# Patient Record
Sex: Female | Born: 1975 | Race: Black or African American | Hispanic: No | Marital: Single | Smoking: Never smoker
Health system: Southern US, Community
[De-identification: ages and names within clinical notes are randomized; demographics above are authoritative.]

## PROBLEM LIST (undated history)

## (undated) ENCOUNTER — Inpatient Hospital Stay (HOSPITAL_COMMUNITY): Payer: Self-pay

## (undated) DIAGNOSIS — T7840XA Allergy, unspecified, initial encounter: Secondary | ICD-10-CM

## (undated) DIAGNOSIS — K219 Gastro-esophageal reflux disease without esophagitis: Secondary | ICD-10-CM

## (undated) HISTORY — PX: FOOT SURGERY: SHX648

## (undated) HISTORY — DX: Allergy, unspecified, initial encounter: T78.40XA

## (undated) HISTORY — PX: COLONOSCOPY: SHX174

## (undated) HISTORY — PX: WISDOM TOOTH EXTRACTION: SHX21

---

## 2012-12-09 ENCOUNTER — Encounter (HOSPITAL_COMMUNITY): Payer: Self-pay | Admitting: Emergency Medicine

## 2012-12-09 ENCOUNTER — Emergency Department (HOSPITAL_COMMUNITY)
Admission: EM | Admit: 2012-12-09 | Discharge: 2012-12-09 | Disposition: A | Discharge status: Home / Self Care | Payer: Self-pay | Attending: Emergency Medicine | Admitting: Emergency Medicine

## 2012-12-09 DIAGNOSIS — IMO0001 Reserved for inherently not codable concepts without codable children: Secondary | ICD-10-CM | POA: Insufficient documentation

## 2012-12-09 DIAGNOSIS — R05 Cough: Secondary | ICD-10-CM | POA: Insufficient documentation

## 2012-12-09 DIAGNOSIS — R109 Unspecified abdominal pain: Secondary | ICD-10-CM | POA: Insufficient documentation

## 2012-12-09 DIAGNOSIS — R059 Cough, unspecified: Secondary | ICD-10-CM | POA: Insufficient documentation

## 2012-12-09 DIAGNOSIS — R6883 Chills (without fever): Secondary | ICD-10-CM | POA: Insufficient documentation

## 2012-12-09 DIAGNOSIS — Z3202 Encounter for pregnancy test, result negative: Secondary | ICD-10-CM | POA: Insufficient documentation

## 2012-12-09 DIAGNOSIS — R350 Frequency of micturition: Secondary | ICD-10-CM | POA: Insufficient documentation

## 2012-12-09 DIAGNOSIS — R197 Diarrhea, unspecified: Secondary | ICD-10-CM | POA: Insufficient documentation

## 2012-12-09 DIAGNOSIS — J3489 Other specified disorders of nose and nasal sinuses: Secondary | ICD-10-CM | POA: Insufficient documentation

## 2012-12-09 DIAGNOSIS — R110 Nausea: Secondary | ICD-10-CM | POA: Insufficient documentation

## 2012-12-09 LAB — CBC WITH DIFFERENTIAL/PLATELET
Basophils Absolute: 0 10*3/uL (ref 0.0–0.1)
Basophils Relative: 1 % (ref 0–1)
Eosinophils Absolute: 0 10*3/uL (ref 0.0–0.7)
Eosinophils Relative: 0 % (ref 0–5)
HCT: 37.4 % (ref 36.0–46.0)
Lymphocytes Relative: 51 % — ABNORMAL HIGH (ref 12–46)
MCH: 31.6 pg (ref 26.0–34.0)
MCHC: 34.8 g/dL (ref 30.0–36.0)
MCV: 90.8 fL (ref 78.0–100.0)
Monocytes Relative: 7 % (ref 3–12)
Neutrophils Relative %: 41 % — ABNORMAL LOW (ref 43–77)
Platelets: 259 10*3/uL (ref 150–400)
RBC: 4.12 MIL/uL (ref 3.87–5.11)
RDW: 12.4 % (ref 11.5–15.5)
WBC: 3.7 10*3/uL — ABNORMAL LOW (ref 4.0–10.5)

## 2012-12-09 LAB — URINE MICROSCOPIC-ADD ON

## 2012-12-09 LAB — URINALYSIS, ROUTINE W REFLEX MICROSCOPIC
Bilirubin Urine: NEGATIVE
Nitrite: NEGATIVE
Protein, ur: NEGATIVE mg/dL
Specific Gravity, Urine: 1.01 (ref 1.005–1.030)
Urobilinogen, UA: 0.2 mg/dL (ref 0.0–1.0)

## 2012-12-09 LAB — COMPREHENSIVE METABOLIC PANEL
ALT: 20 U/L (ref 0–35)
AST: 24 U/L (ref 0–37)
Albumin: 4.6 g/dL (ref 3.5–5.2)
Alkaline Phosphatase: 42 U/L (ref 39–117)
BUN: 9 mg/dL (ref 6–23)
CO2: 26 mEq/L (ref 19–32)
Calcium: 10 mg/dL (ref 8.4–10.5)
Chloride: 104 mEq/L (ref 96–112)
GFR calc Af Amer: 84 mL/min — ABNORMAL LOW (ref >90)
Glucose, Bld: 99 mg/dL (ref 70–99)
Potassium: 3.5 mEq/L (ref 3.5–5.1)
Sodium: 141 mEq/L (ref 135–145)
Total Bilirubin: 0.3 mg/dL (ref 0.3–1.2)
Total Protein: 7.8 g/dL (ref 6.0–8.3)

## 2012-12-09 LAB — PREGNANCY, URINE: Preg Test, Ur: NEGATIVE

## 2012-12-09 MED ORDER — ONDANSETRON HCL 4 MG PO TABS: 4.0000 mg | ORAL_TABLET | Freq: Four times a day (QID) | ORAL | Status: DC

## 2012-12-09 MED ORDER — ONDANSETRON 4 MG PO TBDP
4.0000 mg | ORAL_TABLET | Freq: Once | ORAL | Status: AC
  Administered 2012-12-09: 4 mg via ORAL
  Filled 2012-12-09: qty 1

## 2012-12-09 MED ORDER — FAMOTIDINE 20 MG PO TABS: 20.0000 mg | ORAL_TABLET | Freq: Two times a day (BID) | ORAL | Status: DC

## 2012-12-09 MED ORDER — SODIUM CHLORIDE 0.9 % IV SOLN
INTRAVENOUS | Status: AC
  Administered 2012-12-09: 09:00:00 via INTRAVENOUS

## 2012-12-09 NOTE — ED Notes (Signed)
Patient with no complaints at this time. Respirations even and unlabored. Skin warm/dry. Discharge instructions reviewed with patient at this time. Patient given opportunity to voice concerns/ask questions. IV removed per policy and band-aid applied to site. Patient discharged at this time and left Emergency Department with steady gait.  

## 2012-12-09 NOTE — ED Notes (Signed)
Patient c/o intermittent nausea, cough and diarrhea.  Chest soreness from coughing, slight epigastric and suprapubic tenderness to palpation.  Denies vomiting, dysuria or burning/stinging w/urination.

## 2012-12-09 NOTE — ED Notes (Signed)
Patient is resting comfortably. 

## 2012-12-09 NOTE — ED Notes (Signed)
Pt c/o  Intermittent chills, nausea, diarrhea that started a week ago,

## 2012-12-09 NOTE — ED Provider Notes (Signed)
CSN: [id number]     Arrival date & time 12/09/12  0807 History   First MD Initiated Contact with Patient 12/09/12 [phone]     Chief Complaint  Patient presents with  . Nausea  . Diarrhea   (Consider location/radiation/quality/duration/timing/severity/associated sxs/prior Treatment) Patient is a 37 y.o. female presenting with diarrhea. The history is provided by the patient.  Diarrhea Quality:  Watery (brown) Onset quality:  Gradual Duration:  1 week Timing:  Sporadic Progression:  Worsening Relieved by:  Nothing Worsened by:  Nothing tried Ineffective treatments:  Liquids Associated symptoms: abdominal pain (cramping), chills, cough, myalgias and URI   Associated symptoms: no fever and no headaches  Vomiting: nausea.   Risk factors: sick contacts   Risk factors: no recent antibiotic use, no suspicious food intake and no travel to endemic areas    Meghan Acosta is a 37 y.o. female who presents to the ED with nausea and diarrhea that started a week ago. She has stayed home and drinking clear liquids but continues to have diarrhea. Having 10 stools per day that are watery brown. Nausea but no vomiting. Thinks she has lost weight.   History reviewed. No pertinent past medical history. History reviewed. No pertinent past surgical history. No family history on file. History  Substance Use Topics  . Smoking status: Never Smoker   . Smokeless tobacco: Not on file  . Alcohol Use: No   OB History   Grav Para Term Preterm Abortions TAB SAB Ect Mult Living                 Review of Systems  Constitutional: Positive for chills. Negative for fever.  HENT: Positive for congestion. Negative for ear pain, sore throat and trouble swallowing.   Eyes: Negative for redness.  Respiratory: Positive for cough. Negative for chest tightness and shortness of breath.   Cardiovascular: Negative for chest pain.  Gastrointestinal: Positive for nausea, abdominal pain (cramping) and diarrhea.  Vomiting: nausea.  Genitourinary: Positive for frequency. Negative for dysuria, urgency, vaginal bleeding and vaginal discharge.  Musculoskeletal: Positive for myalgias. Negative for back pain.  Skin: Negative for rash.  Allergic/Immunologic: Negative for immunocompromised state.  Neurological: Negative for headaches.  Psychiatric/Behavioral: The patient is not nervous/anxious.     Allergies  Review of patient's allergies indicates no known allergies.  Home Medications  No current outpatient prescriptions on file. BP 116/74  Pulse 86  Temp(Src) 98.8 F (37.1 C) (Oral)  Resp 18  SpO2 100%  LMP 11/23/2012 Physical Exam  Nursing note and vitals reviewed. Constitutional: She is oriented to person, place, and time. She appears well-developed and well-nourished. No distress.  HENT:  Head: Normocephalic and atraumatic.  Eyes: EOM are normal.  Neck: Neck supple.  Cardiovascular: Normal rate and regular rhythm.   Pulmonary/Chest: Effort normal and breath sounds normal.  Abdominal: Soft. Normal appearance. Bowel sounds are increased. There is tenderness. There is no rigidity, no rebound, no guarding and no CVA tenderness.  Minimal tenderness with deep palpation across lower abdomen.  Musculoskeletal: Normal range of motion.  Neurological: She is alert and oriented to person, place, and time. No cranial nerve deficit.  Skin: Skin is warm and dry.  Psychiatric: She has a normal mood and affect. Her behavior is normal.   Results for orders placed during the hospital encounter of 12/09/12 (from the past 24 hour(s))  PREGNANCY, URINE     Status: None   Collection Time    12/09/12  8:20 AM  Result Value Range   Preg Test, Ur NEGATIVE  NEGATIVE  URINALYSIS, ROUTINE W REFLEX MICROSCOPIC     Status: Abnormal   Collection Time    12/09/12  8:20 AM      Result Value Range   Color, Urine YELLOW  YELLOW   APPearance CLEAR  CLEAR   Specific Gravity, Urine 1.010  1.005 - 1.030   pH 6.0   5.0 - 8.0   Glucose, UA NEGATIVE  NEGATIVE mg/dL   Hgb urine dipstick SMALL (*) NEGATIVE   Bilirubin Urine NEGATIVE  NEGATIVE   Ketones, ur NEGATIVE  NEGATIVE mg/dL   Protein, ur NEGATIVE  NEGATIVE mg/dL   Urobilinogen, UA 0.2  0.0 - 1.0 mg/dL   Nitrite NEGATIVE  NEGATIVE   Leukocytes, UA NEGATIVE  NEGATIVE  URINE MICROSCOPIC-ADD ON     Status: None   Collection Time    12/09/12  8:20 AM      Result Value Range   Squamous Epithelial / LPF RARE  RARE   WBC, UA 0-2  <3 WBC/hpf   RBC / HPF 3-6  <3 RBC/hpf   Bacteria, UA RARE  RARE  CBC WITH DIFFERENTIAL     Status: Abnormal   Collection Time    12/09/12  8:48 AM      Result Value Range   WBC 3.7 (*) 4.0 - 10.5 K/uL   RBC 4.12  3.87 - 5.11 MIL/uL   Hemoglobin 13.0  12.0 - 15.0 g/dL   HCT 16.1  09.6 - 04.5 %   MCV 90.8  78.0 - 100.0 fL   MCH 31.6  26.0 - 34.0 pg   MCHC 34.8  30.0 - 36.0 g/dL   RDW 40.9  81.1 - 91.4 %   Platelets 259  150 - 400 K/uL   Neutrophils Relative % 41 (*) 43 - 77 %   Neutro Abs 1.5 (*) 1.7 - 7.7 K/uL   Lymphocytes Relative 51 (*) 12 - 46 %   Lymphs Abs 1.9  0.7 - 4.0 K/uL   Monocytes Relative 7  3 - 12 %   Monocytes Absolute 0.3  0.1 - 1.0 K/uL   Eosinophils Relative 0  0 - 5 %   Eosinophils Absolute 0.0  0.0 - 0.7 K/uL   Basophils Relative 1  0 - 1 %   Basophils Absolute 0.0  0.0 - 0.1 K/uL  COMPREHENSIVE METABOLIC PANEL     Status: Abnormal   Collection Time    12/09/12  8:48 AM      Result Value Range   Sodium 141  135 - 145 mEq/L   Potassium 3.5  3.5 - 5.1 mEq/L   Chloride 104  96 - 112 mEq/L   CO2 26  19 - 32 mEq/L   Glucose, Bld 99  70 - 99 mg/dL   BUN 9  6 - 23 mg/dL   Creatinine, Ser 7.82  0.50 - 1.10 mg/dL   Calcium 95.6  8.4 - 21.3 mg/dL   Total Protein 7.8  6.0 - 8.3 g/dL   Albumin 4.6  3.5 - 5.2 g/dL   AST 24  0 - 37 U/L   ALT 20  0 - 35 U/L   Alkaline Phosphatase 42  39 - 117 U/L   Total Bilirubin 0.3  0.3 - 1.2 mg/dL   GFR calc non Af Amer 72 (*) >90 mL/min   GFR calc Af  Amer 84 (*) >90 mL/min    ED Course  Procedures @ 10:00  after IV hydration and Zofran for nausea the patient has no nausea. She is feeling better.  MDM  37 y.o. female with nausea and diarrhea x 1 week. She has not vomited or had diarrhea since she arrived to the ED. Labs and urine do not indicate dehydration at this time. Will start clear liquid diet and then advance to the B.R.A.T diet. She is to follow up with her PCP. Discussed with the patient need for follow up and stool culture if symptoms persist. She voices understanding.    Medication List    TAKE these medications       famotidine 20 MG tablet  Commonly known as:  PEPCID  Take 1 tablet (20 mg total) by mouth 2 (two) times daily.     ondansetron 4 MG tablet  Commonly known as:  ZOFRAN  Take 1 tablet (4 mg total) by mouth every 6 (six) hours.      ASK your doctor about these medications       MUCINEX FAST-MAX COLD & SINUS 10-650-400 MG/20ML Liqd  Generic drug:  Phenylephrine-APAP-Guaifenesin  Take 20 mLs by mouth every 4 (four) hours as needed (Congestion and Cough).     multivitamin with minerals Tabs tablet  Take 1 tablet by mouth daily.            [facility] Orlene Och, NP 12/09/12 1005

## 2012-12-12 NOTE — ED Provider Notes (Signed)
Medical screening examination/treatment/procedure(s) were performed by non-physician practitioner and as supervising physician I was immediately available for consultation/collaboration.  EKG Interpretation   None         Jaevin Medearis L Stanlee Roehrig, MD 12/12/12 1105 

## 2013-02-01 ENCOUNTER — Ambulatory Visit: Payer: Self-pay | Admitting: Family Medicine

## 2013-02-01 VITALS — BP 96/54 | HR 83 | Temp 98.3°F | Resp 16 | Ht 67.0 in | Wt 136.0 lb

## 2013-02-01 DIAGNOSIS — R350 Frequency of micturition: Secondary | ICD-10-CM

## 2013-02-01 DIAGNOSIS — R7309 Other abnormal glucose: Secondary | ICD-10-CM

## 2013-02-01 DIAGNOSIS — J329 Chronic sinusitis, unspecified: Secondary | ICD-10-CM

## 2013-02-01 LAB — POCT CBC
Granulocyte percent: 56.3 %G (ref 37–80)
HCT, POC: 41.5 % (ref 37.7–47.9)
Hemoglobin: 13 g/dL (ref 12.2–16.2)
Lymph, poc: 2 (ref 0.6–3.4)
MCH, POC: 30.9 pg (ref 27–31.2)
MCHC: 31.3 g/dL — AB (ref 31.8–35.4)
MCV: 98.5 fL — AB (ref 80–97)
MID (cbc): 0.3 (ref 0–0.9)
MPV: 8.8 fL (ref 0–99.8)
POC Granulocyte: 2.9 (ref 2–6.9)
POC LYMPH PERCENT: 38.8 %L (ref 10–50)
POC MID %: 4.9 %M (ref 0–12)
Platelet Count, POC: 239 10*3/uL (ref 142–424)
RBC: 4.21 M/uL (ref 4.04–5.48)
RDW, POC: 12.8 %
WBC: 5.2 10*3/uL (ref 4.6–10.2)

## 2013-02-01 LAB — POCT UA - MICROSCOPIC ONLY
Amorphous: POSITIVE
Casts, Ur, LPF, POC: NEGATIVE
Crystals, Ur, HPF, POC: NEGATIVE
WBC, Ur, HPF, POC: NEGATIVE
Yeast, UA: NEGATIVE

## 2013-02-01 LAB — POCT URINALYSIS DIPSTICK
BILIRUBIN UA: NEGATIVE
Glucose, UA: NEGATIVE
Ketones, UA: NEGATIVE
Leukocytes, UA: NEGATIVE
Nitrite, UA: NEGATIVE
PH UA: 6
Protein, UA: NEGATIVE
SPEC GRAV UA: 1.015
Urobilinogen, UA: 0.2

## 2013-02-01 LAB — POCT GLYCOSYLATED HEMOGLOBIN (HGB A1C): Hemoglobin A1C: 5

## 2013-02-01 MED ORDER — OXYBUTYNIN CHLORIDE 5 MG PO TABS: 5.0000 mg | ORAL_TABLET | Freq: Three times a day (TID) | ORAL | Status: DC | PRN

## 2013-02-01 MED ORDER — CETIRIZINE HCL 10 MG PO TABS
10.0000 mg | ORAL_TABLET | Freq: Every day | ORAL | Status: DC
Start: 2013-02-01 — End: 2013-11-14

## 2013-02-01 MED ORDER — PREDNISONE 20 MG PO TABS: ORAL_TABLET | ORAL | Status: DC

## 2013-02-01 MED ORDER — FLUTICASONE PROPIONATE 50 MCG/ACT NA SUSP: 2.0000 | Freq: Every day | NASAL | Status: DC

## 2013-02-01 NOTE — Progress Notes (Addendum)
Subjective:    Patient ID: Meghan Acosta, female    DOB: 1975-07-31, 38 y.o.   MRN: [id number]  Chief Complaint  Patient presents with  . Urinary Frequency  . Cough    sinusitis  . Abscess    groin area    HPI  This chart was scribed for Carley HammedEva Shaw-MD by Smiley HousemanFallon Davis, Scribe. This patient was seen in room 5 and the patient's care was started at 2:05 PM.  HPI Comments: Meghan Acosta is a 38 y.o. female who presents to [facility] complaining of a new bump on her left labia.  Pt states she was getting a [place] wax sev mos ago when she first noticed it - the woman waxing her said it was normal and that a lot of women had them.  It has not changed at all in the past sev mos.  Not tender, no draining. Pt states she has vaginal discharge, but usually just after her menstrual cycle and resolves on own monthly.    She also complains of urinary frequency for the past sev mos.  She reports that some days she is urinating almost every 20 minutes.  She states she is waking up multiple times during the night to urinate - 3 to 5 times.  She denies any dysuria or urinary incontinence or hesitancy. No pelvic or abd pain.  She states she feel thirsty, especially at night and does drink a lot of water. Sometimes she has urinary urgency and sometimes she has an urge to void and then is not able to void much urine.   She states she has loss weight recently without trying.  She attributed this to stress.  No change in appetite.   She states she has had a non productive cough that started about a month ago. She was prescribed azythromycin about a month ago for sinus infection, but her cough hasn't subsided.  She denies any ShOB or wheezing.  She reports she has frontal sinus pressure, left parietal sharp HA pain, ear "popping", and a couple epistaxis episodes.  She thinks the epistaxis is from the cold weather.  Pt states she uses Nasonex in the morning and evenings, but denies  taking allergy medications.  Has had a course of steroids for her sinuses in the past which has helped a lot and she would like to try this again if poss.  Pt also complains of right breast tenderness, especially around the nipple area.  She denies nipple discharge. Just for the past sev days - attributed to her bra.   No past surgical history on file.  No family history on file.  History   Social History  . Marital Status: Single    Spouse Name: N/A    Number of Children: N/A  . Years of Education: N/A   Occupational History  . Not on file.   Social History Main Topics  . Smoking status: Never Smoker   . Smokeless tobacco: Not on file  . Alcohol Use: No  . Drug Use: No  . Sexual Activity: Not on file   Other Topics Concern  . Not on file   Social History Narrative  . No narrative on file    No Known Allergies  There are no active problems to display for this patient.   Review of Systems  Constitutional: Positive for unexpected weight change. Negative for fever and chills.  HENT: Positive for congestion, nosebleeds, postnasal drip, rhinorrhea and sinus pressure (frontal).  Negative for dental problem, ear discharge, ear pain, facial swelling, hearing loss and sore throat.   Respiratory: Positive for cough. Negative for shortness of breath.   Cardiovascular: Negative for chest pain.  Gastrointestinal: Negative for nausea, vomiting, abdominal pain and diarrhea.  Genitourinary: Positive for urgency, frequency, decreased urine volume and vaginal discharge. Negative for dysuria, hematuria, flank pain, vaginal bleeding, difficulty urinating, genital sores, vaginal pain, menstrual problem, pelvic pain and dyspareunia.  Musculoskeletal: Negative for back pain.  Skin: Negative for color change and rash.  Neurological: Positive for headaches (left parietal sharp pain). Negative for syncope.  right lateral breast pain   Triage Vitals: BP 96/54  Pulse 83  Temp(Src) 98.3 F  (36.8 C) (Oral)  Resp 16  Ht 5\' 7"  (1.702 m)  Wt 136 lb (61.689 kg)  BMI 21.30 kg/m2  SpO2 100% Objective:   Physical Exam  Nursing note and vitals reviewed. Constitutional: She is oriented to person, place, and time. Vital signs are normal. She appears well-developed and well-nourished. No distress.  HENT:  Head: Normocephalic and atraumatic.  Right Ear: External ear and ear canal normal. Tympanic membrane is retracted.  Left Ear: Tympanic membrane, external ear and ear canal normal.  Nose: Mucosal edema present. Right sinus exhibits frontal sinus tenderness.  Mouth/Throat: Uvula is midline, oropharynx is clear and moist and mucous membranes are normal. No oropharyngeal exudate, posterior oropharyngeal edema or posterior oropharyngeal erythema.  Eyes: Conjunctivae and EOM are normal. Right eye exhibits no discharge. Left eye exhibits no discharge.  Neck: Trachea normal. Neck supple. No tracheal deviation present. No thyromegaly present.  Cardiovascular: Normal rate, regular rhythm and normal heart sounds.   Pulmonary/Chest: Effort normal and breath sounds normal. No respiratory distress. She has no wheezes. She has no rales.  Genitourinary:    There is breast tenderness (Right breast). No breast swelling, discharge or bleeding. There is no rash, tenderness, lesion or injury on the right labia. There is no rash, tenderness, lesion or injury on the left labia.  Small 3-4 mm subcu firm nodule palpable in mid left external labia majora - not visible at all, no overlying skin changes Normal right breast exam, with mild fibrocystic changes  Musculoskeletal: Normal range of motion.  Lymphadenopathy:       Head (right side): No submandibular, no tonsillar, no preauricular, no posterior auricular and no occipital adenopathy present.       Head (left side): No submandibular, no tonsillar, no preauricular, no posterior auricular and no occipital adenopathy present.    She has no cervical  adenopathy.       Right axillary: No pectoral and no lateral adenopathy present.       Right: No supraclavicular adenopathy present.       Left: No supraclavicular adenopathy present.  Neurological: She is alert and oriented to person, place, and time.  Skin: Skin is warm and dry.  Psychiatric: She has a normal mood and affect. Her behavior is normal.   Results for orders placed in visit on 02/01/13  POCT UA - MICROSCOPIC ONLY      Result Value Range   WBC, Ur, HPF, POC neg     RBC, urine, microscopic 0-3     Bacteria, U Microscopic 1+     Mucus, UA mod     Epithelial cells, urine per micros 0-2     Crystals, Ur, HPF, POC neg     Casts, Ur, LPF, POC neg     Yeast, UA neg  Amorphous pos    POCT URINALYSIS DIPSTICK      Result Value Range   Color, UA yellow     Clarity, UA clear     Glucose, UA neg     Bilirubin, UA neg     Ketones, UA neg     Spec Grav, UA 1.015     Blood, UA trace-lysed     pH, UA 6.0     Protein, UA neg     Urobilinogen, UA 0.2     Nitrite, UA neg     Leukocytes, UA Negative    POCT CBC      Result Value Range   WBC 5.2  4.6 - 10.2 K/uL   Lymph, poc 2.0  0.6 - 3.4   POC LYMPH PERCENT 38.8  10 - 50 %L   MID (cbc) 0.3  0 - 0.9   POC MID % 4.9  0 - 12 %M   POC Granulocyte 2.9  2 - 6.9   Granulocyte percent 56.3  37 - 80 %G   RBC 4.21  4.04 - 5.48 M/uL   Hemoglobin 13.0  12.2 - 16.2 g/dL   HCT, POC 16.1  09.6 - 47.9 %   MCV 98.5 (*) 80 - 97 fL   MCH, POC 30.9  27 - 31.2 pg   MCHC 31.3 (*) 31.8 - 35.4 g/dL   RDW, POC 04.5     Platelet Count, POC 239  142 - 424 K/uL   MPV 8.8  0 - 99.8 fL  POCT GLYCOSYLATED HEMOGLOBIN (HGB A1C)      Result Value Range   Hemoglobin A1C 5.0        Assessment & Plan:  Urinary frequency - Plan: POCT UA - Microscopic Only, POCT urinalysis dipstick - unknown etiology, rec tsh at some point - esp cons unintentional weight loss - but pt w/o health insurance so outside labs deferred for now.  Rec therapeutic trial of  oxybutynin.  Recommend drinking plenty of water during the day, but stop after 6 p.m.  Abnormal blood sugar - Plan: POCT CBC, POCT glycosylated hemoglobin (Hb A1C)  Unspecified sinusitis (chronic) - try steroid taper due to prolonged congestion sxs, cont nasal steroid.  Inclusion cyst on left labia - pt reassured - benign and small - can try warm compresses sev x/d for sev days to see if can reduce size but unlikely to be successful.  Just leave alone - has the potential to become infected so if tender or increasing in size, RTC.  Meds ordered this encounter  Medications  . predniSONE (DELTASONE) 20 MG tablet    Sig: Take 3 tabs po qd x 3d, then 2 tabs po qd x 3d, then 1 tab po qd x 3d    Dispense:  18 tablet    Refill:  0  . cetirizine (ZYRTEC) 10 MG tablet    Sig: Take 1 tablet (10 mg total) by mouth daily.    Dispense:  30 tablet    Refill:  11  . fluticasone (FLONASE) 50 MCG/ACT nasal spray    Sig: Place 2 sprays into both nostrils at bedtime.    Dispense:  16 g    Refill:  2  . oxybutynin (DITROPAN) 5 MG tablet    Sig: Take 1 tablet (5 mg total) by mouth every 8 (eight) hours as needed for bladder spasms.    Dispense:  90 tablet    Refill:  1    I personally performed the  services described in this documentation, which was scribed in my presence. The recorded information has been reviewed and considered, and addended by me as needed.  Delman Cheadle, MD MPH

## 2013-11-14 ENCOUNTER — Encounter (HOSPITAL_COMMUNITY): Payer: Self-pay | Admitting: Emergency Medicine

## 2013-11-14 ENCOUNTER — Emergency Department (HOSPITAL_COMMUNITY)
Admission: EM | Admit: 2013-11-14 | Discharge: 2013-11-14 | Disposition: A | Discharge status: Home / Self Care | Payer: BC Managed Care – PPO | Attending: Emergency Medicine | Admitting: Emergency Medicine

## 2013-11-14 DIAGNOSIS — Z3202 Encounter for pregnancy test, result negative: Secondary | ICD-10-CM | POA: Insufficient documentation

## 2013-11-14 DIAGNOSIS — K922 Gastrointestinal hemorrhage, unspecified: Secondary | ICD-10-CM

## 2013-11-14 DIAGNOSIS — R195 Other fecal abnormalities: Secondary | ICD-10-CM | POA: Diagnosis present

## 2013-11-14 DIAGNOSIS — R197 Diarrhea, unspecified: Secondary | ICD-10-CM | POA: Insufficient documentation

## 2013-11-14 DIAGNOSIS — K921 Melena: Secondary | ICD-10-CM | POA: Insufficient documentation

## 2013-11-14 LAB — CBC WITH DIFFERENTIAL/PLATELET
BASOS ABS: 0 10*3/uL (ref 0.0–0.1)
Basophils Relative: 0 % (ref 0–1)
Eosinophils Absolute: 0 10*3/uL (ref 0.0–0.7)
Eosinophils Relative: 1 % (ref 0–5)
HCT: 34.2 % — ABNORMAL LOW (ref 36.0–46.0)
Hemoglobin: 11.8 g/dL — ABNORMAL LOW (ref 12.0–15.0)
LYMPHS PCT: 53 % — AB (ref 12–46)
Lymphs Abs: 2.5 10*3/uL (ref 0.7–4.0)
MCH: 31.6 pg (ref 26.0–34.0)
MCHC: 34.5 g/dL (ref 30.0–36.0)
MCV: 91.7 fL (ref 78.0–100.0)
Monocytes Absolute: 0.3 10*3/uL (ref 0.1–1.0)
Monocytes Relative: 6 % (ref 3–12)
NEUTROS ABS: 1.9 10*3/uL (ref 1.7–7.7)
NEUTROS PCT: 40 % — AB (ref 43–77)
PLATELETS: 230 10*3/uL (ref 150–400)
RBC: 3.73 MIL/uL — ABNORMAL LOW (ref 3.87–5.11)
RDW: 12.5 % (ref 11.5–15.5)
WBC: 4.7 10*3/uL (ref 4.0–10.5)

## 2013-11-14 LAB — COMPREHENSIVE METABOLIC PANEL
ALBUMIN: 4.1 g/dL (ref 3.5–5.2)
ALK PHOS: 39 U/L (ref 39–117)
ALT: 16 U/L (ref 0–35)
AST: 23 U/L (ref 0–37)
Anion gap: 10 (ref 5–15)
BUN: 12 mg/dL (ref 6–23)
CHLORIDE: 104 meq/L (ref 96–112)
CO2: 26 meq/L (ref 19–32)
Calcium: 9.6 mg/dL (ref 8.4–10.5)
Creatinine, Ser: 1.05 mg/dL (ref 0.50–1.10)
GFR calc Af Amer: 78 mL/min — ABNORMAL LOW (ref >90)
GFR calc non Af Amer: 67 mL/min — ABNORMAL LOW (ref >90)
Glucose, Bld: 88 mg/dL (ref 70–99)
Potassium: 4 mEq/L (ref 3.7–5.3)
SODIUM: 140 meq/L (ref 137–147)
Total Bilirubin: 0.2 mg/dL — ABNORMAL LOW (ref 0.3–1.2)
Total Protein: 7.1 g/dL (ref 6.0–8.3)

## 2013-11-14 LAB — URINALYSIS, ROUTINE W REFLEX MICROSCOPIC
BILIRUBIN URINE: NEGATIVE
Glucose, UA: NEGATIVE mg/dL
Hgb urine dipstick: NEGATIVE
KETONES UR: NEGATIVE mg/dL
Leukocytes, UA: NEGATIVE
Nitrite: NEGATIVE
Protein, ur: NEGATIVE mg/dL
SPECIFIC GRAVITY, URINE: 1.02 (ref 1.005–1.030)
UROBILINOGEN UA: 0.2 mg/dL (ref 0.0–1.0)
pH: 7.5 (ref 5.0–8.0)

## 2013-11-14 LAB — PREGNANCY, URINE: Preg Test, Ur: NEGATIVE

## 2013-11-14 LAB — APTT: APTT: 29 s (ref 24–37)

## 2013-11-14 LAB — PROTIME-INR
INR: 1.02 (ref 0.00–1.49)
Prothrombin Time: 13.5 seconds (ref 11.6–15.2)

## 2013-11-14 MED ORDER — MORPHINE SULFATE 4 MG/ML IJ SOLN: 4.0000 mg | Freq: Once | INTRAMUSCULAR | Status: DC

## 2013-11-14 MED ORDER — ONDANSETRON 4 MG PO TBDP
4.0000 mg | ORAL_TABLET | Freq: Once | ORAL | Status: AC
  Administered 2013-11-14: 4 mg via ORAL
  Filled 2013-11-14: qty 1

## 2013-11-14 MED ORDER — OXYCODONE-ACETAMINOPHEN 5-325 MG PO TABS
2.0000 | ORAL_TABLET | Freq: Once | ORAL | Status: AC
  Administered 2013-11-14: 2 via ORAL
  Filled 2013-11-14: qty 2

## 2013-11-14 NOTE — ED Notes (Signed)
PA at bedside.

## 2013-11-14 NOTE — ED Provider Notes (Signed)
38 year old female, otherwise healthy presents with several small bowel movements with bright red blood. She has some suprapubic cramping associated with this but no rectal pain or discomfort. She denies being on any blood thinners. On exam the patient has mild suprapubic tenderness but no other abdominal tenderness, no guarding, no peritoneal signs. Lungs and heart normal exam, no tachycardia, no peripheral edema, the patient is in no acute distress. Rectal exam with chaperone present showed no stool in the rectal vault, rectal secretions and mucous stain Hemoccult positive, no fissures, no hemorrhoids, no masses. Patient tolerated exam.  The patient is a hemoglobin which is approximately 1 g lower than 10 months ago, she appears hemodynamically stable, we'll observe for short period, try to arrange outpatient followup, would include arteriovenous malformation, diverticulosis, internal hemorrhoid as possibilities, the patient does not appear acutely ill.  Medical screening examination/treatment/procedure(s) were conducted as a shared visit with non-physician practitioner(s) and myself.  I personally evaluated the patient during the encounter.  Clinical Impression:   Final diagnoses:  Lower GI bleed         Vida RollerBrian D Devina Bezold, MD 11/15/13 1455

## 2013-11-14 NOTE — ED Provider Notes (Signed)
CSN: [id number]     Arrival date & time 11/14/13  1650 History   First MD Initiated Contact with Patient 11/14/13 1720     Chief Complaint  Patient presents with  . Blood In Stools   Meghan Acosta is a 38 year-old female who presents to the ED c/o bright red blood in stools and suprapubic pain since this morning. Reports that when she had her normal BM this am she had suprapubic pain and noticed bright red blood mixed in the toilet with her stool. She c/o sharp, cramping pain in her abd that is worse with movement. Describes a sensation of needing a bowel movement. Reports she thought she was starting her menses but has not had any blood on her tampons. Her LMP was 10/16/13 and she has not been sexually active since 5/15. Reports taking advil earlier today without relief. Denies hx of abd surgeries, hemorrhoids, anal pain, hx of fissures, melena, anal penetration, aspirin use or EtOH use. Denies vomiting, chest pain, fevers or chills. Denies dysuria, hematuria, vaginal discharge or bleeding.   (Consider location/radiation/quality/duration/timing/severity/associated sxs/prior Treatment) Patient is a 38 y.o. female presenting with abdominal pain. The history is provided by the patient.  Abdominal Pain Pain location:  Suprapubic Pain quality: bloating, cramping and sharp   Pain radiates to:  Does not radiate Pain severity:  Moderate Onset quality:  Gradual Duration:  10 hours Timing:  Constant Progression:  Unchanged Chronicity:  New Context: not previous surgeries, not recent illness, not recent sexual activity, not recent travel, not retching, not sick contacts, not suspicious food intake and not trauma   Relieved by:  Position changes Worsened by:  Palpation and movement Ineffective treatments:  NSAIDs Associated symptoms: diarrhea, flatus and hematochezia   Associated symptoms: no chest pain, no chills, no constipation, no cough, no dysuria, no fatigue, no fever, no hematemesis, no  hematuria, no melena, no nausea, no shortness of breath, no vaginal bleeding, no vaginal discharge and no vomiting   Risk factors: no alcohol abuse, no aspirin use, not elderly, has not had multiple surgeries, not obese, not pregnant and no recent hospitalization     Past Medical History  Diagnosis Date  . Allergy    History reviewed. No pertinent past surgical history. History reviewed. No pertinent family history. History  Substance Use Topics  . Smoking status: Never Smoker   . Smokeless tobacco: Not on file  . Alcohol Use: No   OB History   Grav Para Term Preterm Abortions TAB SAB Ect Mult Living                 Review of Systems  Constitutional: Negative for fever, chills and fatigue.  Respiratory: Negative for cough and shortness of breath.   Cardiovascular: Negative for chest pain.  Gastrointestinal: Positive for abdominal pain, diarrhea, hematochezia and flatus. Negative for nausea, vomiting, constipation, melena and hematemesis.  Genitourinary: Negative for dysuria, hematuria, vaginal bleeding and vaginal discharge.  All other systems reviewed and are negative.     Allergies  Review of patient's allergies indicates no known allergies.  Home Medications   Prior to Admission medications   Not on File   BP 112/71  Pulse 66  Temp(Src) 98.1 F (36.7 C) (Oral)  Resp 20  Ht 5\' 7"  (1.702 m)  Wt 138 lb (62.596 kg)  BMI 21.61 kg/m2  SpO2 96%  LMP 10/15/2013 Physical Exam  Constitutional: She is oriented to person, place, and time. She appears well-developed and well-nourished. No distress.  HENT:  Head: Normocephalic and atraumatic.  Mouth/Throat: Oropharynx is clear and moist. No oropharyngeal exudate.  Eyes: Pupils are equal, round, and reactive to light. No scleral icterus.  Neck: Normal range of motion. Neck supple.  Cardiovascular: Normal rate, regular rhythm, normal heart sounds and intact distal pulses.  Exam reveals no gallop and no friction rub.   No  murmur heard. Pulmonary/Chest: Effort normal and breath sounds normal. No respiratory distress. She has no wheezes. She has no rales. She exhibits no tenderness.  Abdominal: Soft. Bowel sounds are normal. She exhibits no distension and no mass. There is tenderness. There is no rebound and no guarding.  Moderate suprapubic tenderness  Genitourinary: Guaiac positive stool.  Guaiac positive. No stool in rectal vault. Anal sphincter tone normal. No internal or external hemorrhoids. Female tech chaperone present.   Musculoskeletal: Normal range of motion.  Lymphadenopathy:    She has no cervical adenopathy.  Neurological: She is alert and oriented to person, place, and time.  Skin: Skin is warm and dry. She is not diaphoretic.  Psychiatric: She has a normal mood and affect. Her behavior is normal.    ED Course  Procedures (including critical care time) Labs Review Labs Reviewed  CBC WITH DIFFERENTIAL - Abnormal; Notable for the following:    RBC 3.73 (*)    Hemoglobin 11.8 (*)    HCT 34.2 (*)    Neutrophils Relative % 40 (*)    Lymphocytes Relative 53 (*)    All other components within normal limits  COMPREHENSIVE METABOLIC PANEL - Abnormal; Notable for the following:    Total Bilirubin 0.2 (*)    GFR calc non Af Amer 67 (*)    GFR calc Af Amer 78 (*)    All other components within normal limits  URINALYSIS, ROUTINE W REFLEX MICROSCOPIC  PREGNANCY, URINE  APTT  PROTIME-INR    Imaging Review No results found.   EKG Interpretation None      MDM   Final diagnoses:  Lower GI bleed    At 2048 patient reports she is feeling better and her pain is down to 2/10. She reports she is starting to feel hungry. She has not had a bowel movement since her arrival to ED.  Her Guaiac was positive with no stool in the rectal vault. Sphincter tone was normal. No evidence of hemorrhoids or fissures.   Her Hgb was 11.8 today which is about 1 g/dL lower than her Hgb 9 months ago. Her vitals  have been WNL since arrival. She has not had a BM since arrival to the ED. UA and urine pregnancy is negative. Spoke with patient that this is concerning that she is having blood in her stool and needs to follow-up with her PCP and GI within one week. She does not need further interventions tonight. Advised to avoid NSAIDs, aspirin and alcohol. Advised to return to the ED if new or worsening symptoms. Patient verbalized understanding and agreement with plan.   Patient seen in conjunction with Dr. Hyacinth MeekerMiller.     Meghan Acosta, [place] 11/14/13 2152

## 2013-11-14 NOTE — ED Notes (Signed)
Patient verbalizes understanding of discharge instructions, follow up care and home care. Patient ambulatory out of department at this time escorted by family.

## 2013-11-14 NOTE — Discharge Instructions (Signed)
Avoid using apirin containing products or nsaids (ibuprofen).    [facility] List    Kari BaarsEdward Hawkins MD. Specialty: Pulmonary Disease Contact information: [address]  [address]  [place] [address]  [phone]   Syliva OvermanMargaret Simpson, MD. Specialty: [facility] Contact information: [address]  [place] [address]  [phone]   Lilyan PuntScott Luking, MD. Specialty: [facility] Contact information: [address]  [address]  [place] [address]  [phone]   Avon Gullyesfaye Fanta, MD Specialty: Internal Medicine Contact information: [address]  [place] [address]  [phone]   Catalina PizzaZach Hall, MD. Specialty: Internal Medicine Contact information: [address]  [place] [address]  [phone]   Butch PennyAngus Mcinnis, MD. Specialty: Family Medicine Contact information: [address]  [place] [address]  [phone]   John GiovanniStephen Knowlton, MD. Specialty: [facility] Contact information: [address]  [address]  [place] [address]  [phone]   Carylon Perchesoy Fagan, MD. Specialty: Internal Medicine Contact information: [address]  [address]  [place] [address]  [phone]

## 2013-11-14 NOTE — ED Notes (Signed)
PT c/o cramping lower abdominal pain today with bright red noted with loose bowel movements x2. PT denies any urinary symptoms.

## 2013-11-14 NOTE — ED Notes (Signed)
Pt states she had blood in her stools 2x starting today. She woke up with abdominal pain and made 1 loose bright red stool and thought she may have started her period. Pt went on throughout the day and had another loose stool with bright red blood this afternoon. Pt used tampon but no menses was present when she went to change her tampon.

## 2013-11-15 NOTE — ED Provider Notes (Signed)
Medical screening examination/treatment/procedure(s) were conducted as a shared visit with non-physician practitioner(s) and myself.  I personally evaluated the patient during the encounter  Please see my separate respective documentation pertaining to this patient encounter   Vida RollerBrian D Lillien Petronio, MD 11/15/13 1455

## 2013-11-16 ENCOUNTER — Encounter: Payer: Self-pay | Admitting: Gastroenterology

## 2013-11-16 ENCOUNTER — Ambulatory Visit (INDEPENDENT_AMBULATORY_CARE_PROVIDER_SITE_OTHER): Payer: BC Managed Care – PPO | Admitting: Gastroenterology

## 2013-11-16 ENCOUNTER — Other Ambulatory Visit: Payer: Self-pay

## 2013-11-16 VITALS — BP 105/62 | HR 74 | Temp 97.8°F | Ht 67.0 in | Wt 139.0 lb

## 2013-11-16 DIAGNOSIS — K625 Hemorrhage of anus and rectum: Secondary | ICD-10-CM | POA: Insufficient documentation

## 2013-11-16 DIAGNOSIS — R195 Other fecal abnormalities: Secondary | ICD-10-CM

## 2013-11-16 MED ORDER — PEG 3350-KCL-NA BICARB-NACL 420 G PO SOLR: 4000.0000 mL | ORAL | Status: DC

## 2013-11-16 NOTE — Patient Instructions (Signed)
1. Colonoscopy with Dr. Rourk. Please see separate instructions. 

## 2013-11-16 NOTE — Progress Notes (Signed)
Primary Care Physician:  No PCP Per Patient  Primary Gastroenterologist:  Roetta SessionsMichael Rourk, MD   Chief Complaint  Patient presents with  . Blood In Stools    bright red  . Abdominal Pain    HPI:  Meghan Acosta is a 38 y.o. female here for evaluation of moderate volume brbpr. Brbpr, large amount initially. Thought menstrual cycle. Stool loose, abdominal cramps. Smaller amount of blood since then but multiple.  Usually bowel movement regular and ok. Rare loose. Once to twice daily. Couple of weeks ago, cramps, loose stool postprandially but no blood in the stool then. No weight loss. No significant gerd, dysphagia, vomiting. Appetite good.     No current outpatient prescriptions on file.   No current facility-administered medications for this visit.    Allergies as of 11/16/2013  . (No Known Allergies)    Past Medical History  Diagnosis Date  . Allergy     History reviewed. No pertinent past surgical history.  Family History  Problem Relation Age of Onset  . Colon cancer Neg Hx   . Cervical cancer Paternal Aunt   . Inflammatory bowel disease Neg Hx     History   Social History  . Marital Status: Single    Spouse Name: N/A    Number of Children: 0  . Years of Education: N/A   Occupational History  . office [employer]   Social History Main Topics  . Smoking status: Never Smoker   . Smokeless tobacco: Not on file  . Alcohol Use: No  . Drug Use: No  . Sexual Activity: Not on file   Other Topics Concern  . Not on file   Social History Narrative  . No narrative on file      ROS:  General: Negative for anorexia, weight loss, fever, chills, fatigue, weakness. Eyes: Negative for vision changes.  ENT: Negative for hoarseness, difficulty swallowing , nasal congestion. CV: Negative for chest pain, angina, palpitations, dyspnea on exertion, peripheral edema.  Respiratory: Negative for dyspnea at rest, dyspnea on exertion, cough, sputum,  wheezing.  GI: See history of present illness. GU:  Negative for dysuria, hematuria, urinary incontinence, urinary frequency, nocturnal urination.  MS: Negative for joint pain, low back pain.  Derm: Negative for rash or itching.  Neuro: Negative for weakness, abnormal sensation, seizure, frequent headaches, memory loss, confusion.  Psych: Negative for anxiety, depression, suicidal ideation, hallucinations.  Endo: Negative for unusual weight change.  Heme: Negative for bruising or bleeding. Allergy: Negative for rash or hives.    Physical Examination:  BP 105/62  Pulse 74  Temp(Src) 97.8 F (36.6 C) (Oral)  Ht 5\' 7"  (1.702 m)  Wt 139 lb (63.05 kg)  BMI 21.77 kg/m2  LMP 11/15/2013   General: Well-nourished, well-developed in no acute distress.  Head: Normocephalic, atraumatic.   Eyes: Conjunctiva pink, no icterus. Mouth: Oropharyngeal mucosa moist and pink , no lesions erythema or exudate. Neck: Supple without thyromegaly, masses, or lymphadenopathy.  Lungs: Clear to auscultation bilaterally.  Heart: Regular rate and rhythm, no murmurs rubs or gallops.  Abdomen: Bowel sounds are normal, nontender, nondistended, no hepatosplenomegaly or masses, no abdominal bruits or    hernia , no rebound or guarding.   Rectal: deferred Extremities: No lower extremity edema. No clubbing or deformities.  Neuro: Alert and oriented x 4 , grossly normal neurologically.  Skin: Warm and dry, no rash or jaundice.   Psych: Alert and cooperative, normal mood and affect.  Labs: Lab Results  Component Value Date   WBC 4.7 11/14/2013   HGB 11.8* 11/14/2013   HCT 34.2* 11/14/2013   MCV 91.7 11/14/2013   PLT 230 11/14/2013   Lab Results  Component Value Date   CREATININE 1.05 11/14/2013   BUN 12 11/14/2013   NA 140 11/14/2013   K 4.0 11/14/2013   CL 104 11/14/2013   CO2 26 11/14/2013   Lab Results  Component Value Date   ALT 16 11/14/2013   AST 23 11/14/2013   ALKPHOS 39 11/14/2013    BILITOT 0.2* 11/14/2013   Lab Results  Component Value Date   INR 1.02 11/14/2013     Imaging Studies: No results found.

## 2013-11-18 NOTE — Assessment & Plan Note (Signed)
38 y/o female with recent onset moderate volume hematochezia. Recent loose stools. Has had intermittent loose stools with meals in the past. Mild anemia. Colonoscopy recommended. Ddx includes IBD, IBS with benign anorectal source for bleeding, malignancy.  I have discussed the risks, alternatives, benefits with regards to but not limited to the risk of reaction to medication, bleeding, infection, perforation and the patient is agreeable to proceed. Written consent to be obtained.

## 2013-11-20 NOTE — Progress Notes (Signed)
No pcp per patient 

## 2013-11-21 ENCOUNTER — Encounter (HOSPITAL_COMMUNITY): Payer: Self-pay | Admitting: Pharmacy Technician

## 2013-12-04 ENCOUNTER — Telehealth: Payer: Self-pay | Admitting: Internal Medicine

## 2013-12-04 ENCOUNTER — Other Ambulatory Visit: Payer: Self-pay

## 2013-12-04 MED ORDER — PEG 3350-KCL-NA BICARB-NACL 420 G PO SOLR: 4000.0000 mL | ORAL | Status: DC

## 2013-12-04 NOTE — Telephone Encounter (Signed)
Done

## 2013-12-04 NOTE — Telephone Encounter (Signed)
Pt called this morning saying that she had mixed her prep with pink lemonade and was wondering if that was ok. I asked GF, DS and JL and they said it should be ok,but we don't recommend doing that. Patient said she would feel more comfortable if we would [facility]. Please advise.  She is wanting to get this ASAP so it can chill before she has to start drinking it.

## 2013-12-05 ENCOUNTER — Encounter (HOSPITAL_COMMUNITY): Payer: Self-pay | Admitting: *Deleted

## 2013-12-05 ENCOUNTER — Ambulatory Visit (HOSPITAL_COMMUNITY)
Admission: RE | Admit: 2013-12-05 | Discharge: 2013-12-05 | Disposition: A | Discharge status: Home / Self Care | Payer: BC Managed Care – PPO | Source: Ambulatory Visit | Attending: Internal Medicine | Admitting: Internal Medicine

## 2013-12-05 ENCOUNTER — Encounter (HOSPITAL_COMMUNITY)
Admission: RE | Disposition: A | Discharge status: Home / Self Care | Payer: Self-pay | Source: Ambulatory Visit | Attending: Internal Medicine

## 2013-12-05 DIAGNOSIS — R195 Other fecal abnormalities: Secondary | ICD-10-CM

## 2013-12-05 DIAGNOSIS — K921 Melena: Secondary | ICD-10-CM | POA: Diagnosis not present

## 2013-12-05 DIAGNOSIS — R109 Unspecified abdominal pain: Secondary | ICD-10-CM | POA: Insufficient documentation

## 2013-12-05 DIAGNOSIS — K625 Hemorrhage of anus and rectum: Secondary | ICD-10-CM

## 2013-12-05 HISTORY — PX: COLONOSCOPY: SHX5424

## 2013-12-05 SURGERY — COLONOSCOPY
Anesthesia: Moderate Sedation

## 2013-12-05 MED ORDER — ONDANSETRON HCL 4 MG/2ML IJ SOLN
INTRAMUSCULAR | Status: AC
  Filled 2013-12-05: qty 2

## 2013-12-05 MED ORDER — SODIUM CHLORIDE 0.9 % IV SOLN
INTRAVENOUS | Status: DC
  Administered 2013-12-05: 10:00:00 via INTRAVENOUS

## 2013-12-05 MED ORDER — MEPERIDINE HCL 100 MG/ML IJ SOLN
INTRAMUSCULAR | Status: AC
  Filled 2013-12-05: qty 2

## 2013-12-05 MED ORDER — STERILE WATER FOR IRRIGATION IR SOLN
Status: DC | PRN
Start: 2013-12-05 — End: 2013-12-05
  Administered 2013-12-05: 11:00:00

## 2013-12-05 MED ORDER — MIDAZOLAM HCL 5 MG/5ML IJ SOLN
INTRAMUSCULAR | Status: DC | PRN
  Administered 2013-12-05: 2 mg via INTRAVENOUS
  Administered 2013-12-05 (×2): 1 mg via INTRAVENOUS
  Administered 2013-12-05: 2 mg via INTRAVENOUS

## 2013-12-05 MED ORDER — MIDAZOLAM HCL 5 MG/5ML IJ SOLN
INTRAMUSCULAR | Status: AC
  Filled 2013-12-05: qty 10

## 2013-12-05 MED ORDER — MEPERIDINE HCL 100 MG/ML IJ SOLN
INTRAMUSCULAR | Status: DC | PRN
  Administered 2013-12-05: 50 mg via INTRAVENOUS
  Administered 2013-12-05 (×2): 25 mg via INTRAVENOUS

## 2013-12-05 MED ORDER — ONDANSETRON HCL 4 MG/2ML IJ SOLN
INTRAMUSCULAR | Status: DC | PRN
  Administered 2013-12-05: 4 mg via INTRAVENOUS

## 2013-12-05 NOTE — Interval H&P Note (Signed)
History and Physical Interval Note:  12/05/2013 10:35 AM  Meghan OverlandNicole D Acosta  has presented today for surgery, with the diagnosis of rectal bleeding/loose stools  The various methods of treatment have been discussed with the patient and family. After consideration of risks, benefits and other options for treatment, the patient has consented to  Procedure(s) with comments: COLONOSCOPY (N/A) - 1045 as a surgical intervention .  The patient's history has been reviewed, patient examined, no change in status, stable for surgery.  I have reviewed the patient's chart and labs.  Questions were answered to the patient's satisfaction.     Meghan Acosta  No change. Some difficulty evacuating recently. Colonoscopy per plan.The risks, benefits, limitations, alternatives and imponderables have been reviewed with the patient. Questions have been answered. All parties are agreeable.

## 2013-12-05 NOTE — H&P (View-Only) (Signed)
Primary Care Physician:  No PCP Per Patient  Primary Gastroenterologist:  Roetta SessionsMichael Rourk, MD   Chief Complaint  Patient presents with  . Blood In Stools    bright red  . Abdominal Pain    HPI:  Meghan Acosta is a 38 y.o. female here for evaluation of moderate volume brbpr. Brbpr, large amount initially. Thought menstrual cycle. Stool loose, abdominal cramps. Smaller amount of blood since then but multiple.  Usually bowel movement regular and ok. Rare loose. Once to twice daily. Couple of weeks ago, cramps, loose stool postprandially but no blood in the stool then. No weight loss. No significant gerd, dysphagia, vomiting. Appetite good.     No current outpatient prescriptions on file.   No current facility-administered medications for this visit.    Allergies as of 11/16/2013  . (No Known Allergies)    Past Medical History  Diagnosis Date  . Allergy     History reviewed. No pertinent past surgical history.  Family History  Problem Relation Age of Onset  . Colon cancer Neg Hx   . Cervical cancer Paternal Aunt   . Inflammatory bowel disease Neg Hx     History   Social History  . Marital Status: Single    Spouse Name: N/A    Number of Children: 0  . Years of Education: N/A   Occupational History  . office [employer]   Social History Main Topics  . Smoking status: Never Smoker   . Smokeless tobacco: Not on file  . Alcohol Use: No  . Drug Use: No  . Sexual Activity: Not on file   Other Topics Concern  . Not on file   Social History Narrative  . No narrative on file      ROS:  General: Negative for anorexia, weight loss, fever, chills, fatigue, weakness. Eyes: Negative for vision changes.  ENT: Negative for hoarseness, difficulty swallowing , nasal congestion. CV: Negative for chest pain, angina, palpitations, dyspnea on exertion, peripheral edema.  Respiratory: Negative for dyspnea at rest, dyspnea on exertion, cough, sputum,  wheezing.  GI: See history of present illness. GU:  Negative for dysuria, hematuria, urinary incontinence, urinary frequency, nocturnal urination.  MS: Negative for joint pain, low back pain.  Derm: Negative for rash or itching.  Neuro: Negative for weakness, abnormal sensation, seizure, frequent headaches, memory loss, confusion.  Psych: Negative for anxiety, depression, suicidal ideation, hallucinations.  Endo: Negative for unusual weight change.  Heme: Negative for bruising or bleeding. Allergy: Negative for rash or hives.    Physical Examination:  BP 105/62  Pulse 74  Temp(Src) 97.8 F (36.6 C) (Oral)  Ht 5\' 7"  (1.702 m)  Wt 139 lb (63.05 kg)  BMI 21.77 kg/m2  LMP 11/15/2013   General: Well-nourished, well-developed in no acute distress.  Head: Normocephalic, atraumatic.   Eyes: Conjunctiva pink, no icterus. Mouth: Oropharyngeal mucosa moist and pink , no lesions erythema or exudate. Neck: Supple without thyromegaly, masses, or lymphadenopathy.  Lungs: Clear to auscultation bilaterally.  Heart: Regular rate and rhythm, no murmurs rubs or gallops.  Abdomen: Bowel sounds are normal, nontender, nondistended, no hepatosplenomegaly or masses, no abdominal bruits or    hernia , no rebound or guarding.   Rectal: deferred Extremities: No lower extremity edema. No clubbing or deformities.  Neuro: Alert and oriented x 4 , grossly normal neurologically.  Skin: Warm and dry, no rash or jaundice.   Psych: Alert and cooperative, normal mood and affect.  Labs: Lab Results  Component Value Date   WBC 4.7 11/14/2013   HGB 11.8* 11/14/2013   HCT 34.2* 11/14/2013   MCV 91.7 11/14/2013   PLT 230 11/14/2013   Lab Results  Component Value Date   CREATININE 1.05 11/14/2013   BUN 12 11/14/2013   NA 140 11/14/2013   K 4.0 11/14/2013   CL 104 11/14/2013   CO2 26 11/14/2013   Lab Results  Component Value Date   ALT 16 11/14/2013   AST 23 11/14/2013   ALKPHOS 39 11/14/2013    BILITOT 0.2* 11/14/2013   Lab Results  Component Value Date   INR 1.02 11/14/2013     Imaging Studies: No results found.

## 2013-12-05 NOTE — Op Note (Signed)
[facility] [address] [place], [address]   COLONOSCOPY PROCEDURE REPORT  PATIENT: Meghan Acosta, Meghan Acosta  MR#: [id number] BIRTHDATE: 04-29-1975 , 38  yrs. old GENDER: female ENDOSCOPIST: R.  Roetta SessionsMichael Jasyah Theurer, MD FACP [facility] REFERRED BY: PROCEDURE DATE:  12/05/2013 PROCEDURE: INDICATIONS: MEDICATIONS: Versed 6 mg IV and Demerol 100 mg IV in divided doses. Zofran 4 mg IV ASA CLASS:       Class II  CONSENT: The risks, benefits, alternatives and imponderables including but not limited to bleeding, perforation as well as the possibility of a missed lesion have been reviewed.  The potential for biopsy, lesion removal, etc. have also been discussed. Questions have been answered.  All parties agreeable.  Please see the history and physical in the medical record for more information.  DESCRIPTION OF PROCEDURE:   After the risks benefits and alternatives of the procedure were thoroughly explained, informed consent was obtained.  The digital rectal exam      The EC-[id number] ([id number]  endoscope was introduced through the anus and advanced to the terminal ileum which was intubated for a short distance. No adverse events experienced.   The quality of the prep was adequate. The instrument was then slowly withdrawn as the colon was fully examined.      COLON FINDINGS: Normal-appearing rectal mucosa.  Normal-appearing colonic mucosa.  The distal 10 cm of terminal ileum mucosa also appeared normal.  Retroflexion was performed. .  Withdrawal time=8 minutes 0 seconds.  The scope was withdrawn and the procedure completed. COMPLICATIONS: There were no immediate complications.  ENDOSCOPIC IMPRESSION: Normal ileocolonoscopy. I suspect trivial bleeding from benign ano-rectal origin  RECOMMENDATIONS: Avoid straining. Add Benefiber to her regimen daily. MiraLAX as needed for constipation. If bleeding recur,s she is to notify us  eSigned:  R. Roetta SessionsMichael Joshlynn Alfonzo, MD Jerrel IvoryFACP [facility]  12/05/2013 11:09 AM   cc:  CPT CODES: ICD CODES:  The ICD and CPT codes recommended by this software are interpretations from the data that the clinical staff has captured with the software.  The verification of the translation of this report to the ICD and CPT codes and modifiers is the sole responsibility of the health care institution and practicing physician where this report was generated.  [employer] will not be held responsible for the validity of the ICD and CPT codes included on this report.  AMA assumes no liability for data contained or not contained herein. CPT is a Publishing rights managerregistered trademark of the [employer].  PATIENT NAME:  Meghan Acosta, Meghan Acosta MR#: [id number]

## 2013-12-05 NOTE — Discharge Instructions (Addendum)
Colonoscopy Discharge Instructions  Read the instructions outlined below and refer to this sheet in the next few weeks. These discharge instructions provide you with general information on caring for yourself after you leave the hospital. Your doctor may also give you specific instructions. While your treatment has been planned according to the most current medical practices available, unavoidable complications occasionally occur. If you have any problems or questions after discharge, call Dr. Jena Gaussourk at [phone]. ACTIVITY  You may resume your regular activity, but move at a slower pace for the next 24 hours.   Take frequent rest periods for the next 24 hours.   Walking will help get rid of the air and reduce the bloated feeling in your belly (abdomen).   No driving for 24 hours (because of the medicine (anesthesia) used during the test).    Do not sign any important legal documents or operate any machinery for 24 hours (because of the anesthesia used during the test).  NUTRITION  Drink plenty of fluids.   You may resume your normal diet as instructed by your doctor.   Begin with a light meal and progress to your normal diet. Heavy or fried foods are harder to digest and may make you feel sick to your stomach (nauseated).   Avoid alcoholic beverages for 24 hours or as instructed.  MEDICATIONS  You may resume your normal medications unless your doctor tells you otherwise.  WHAT YOU CAN EXPECT TODAY  Some feelings of bloating in the abdomen.   Passage of more gas than usual.   Spotting of blood in your stool or on the toilet paper.  IF YOU HAD POLYPS REMOVED DURING THE COLONOSCOPY:  No aspirin products for 7 days or as instructed.   No alcohol for 7 days or as instructed.   Eat a soft diet for the next 24 hours.  FINDING OUT THE RESULTS OF YOUR TEST Not all test results are available during your visit. If your test results are not back during the visit, make an appointment  with your caregiver to find out the results. Do not assume everything is normal if you have not heard from your caregiver or the medical facility. It is important for you to follow up on all of your test results.  SEEK IMMEDIATE MEDICAL ATTENTION IF:  You have more than a spotting of blood in your stool.   Your belly is swollen (abdominal distention).   You are nauseated or vomiting.   You have a temperature over 101.   You have abdominal pain or discomfort that is severe or gets worse throughout the day.   Constipation information provided  Avoid straining.  Benefiber 2 teaspoons twice daily  May use MiraLAX-1 cap full daily as needed for occasional constipation  Limit toilet time to 2-3 minutes  Call me if bleeding recurs  Constipation Constipation is when a person has fewer than three bowel movements a week, has difficulty having a bowel movement, or has stools that are dry, hard, or larger than normal. As people grow older, constipation is more common. If you try to fix constipation with medicines that make you have a bowel movement (laxatives), the problem may get worse. Long-term laxative use may cause the muscles of the colon to become weak. A low-fiber diet, not taking in enough fluids, and taking certain medicines may make constipation worse.  CAUSES   Certain medicines, such as antidepressants, pain medicine, iron supplements, antacids, and water pills.   Certain diseases, such as diabetes,  irritable bowel syndrome (IBS), thyroid disease, or depression.   Not drinking enough water.   Not eating enough fiber-rich foods.   Stress or travel.   Lack of physical activity or exercise.   Ignoring the urge to have a bowel movement.   Using laxatives too much.  SIGNS AND SYMPTOMS   Having fewer than three bowel movements a week.   Straining to have a bowel movement.   Having stools that are hard, dry, or larger than normal.   Feeling full or bloated.    Pain in the lower abdomen.   Not feeling relief after having a bowel movement.  DIAGNOSIS  Your health care provider will take a medical history and perform a physical exam. Further testing may be done for severe constipation. Some tests may include:  A barium enema X-ray to examine your rectum, colon, and, sometimes, your small intestine.   A sigmoidoscopy to examine your lower colon.   A colonoscopy to examine your entire colon. TREATMENT  Treatment will depend on the severity of your constipation and what is causing it. Some dietary treatments include drinking more fluids and eating more fiber-rich foods. Lifestyle treatments may include regular exercise. If these diet and lifestyle recommendations do not help, your health care provider may recommend taking over-the-counter laxative medicines to help you have bowel movements. Prescription medicines may be prescribed if over-the-counter medicines do not work.  HOME CARE INSTRUCTIONS   Eat foods that have a lot of fiber, such as fruits, vegetables, whole grains, and beans.  Limit foods high in fat and processed sugars, such as french fries, hamburgers, cookies, candies, and soda.   A fiber supplement may be added to your diet if you cannot get enough fiber from foods.   Drink enough fluids to keep your urine clear or pale yellow.   Exercise regularly or as directed by your health care provider.   Go to the restroom when you have the urge to go. Do not hold it.   Only take over-the-counter or prescription medicines as directed by your health care provider. Do not take other medicines for constipation without talking to your health care provider first.  SEEK IMMEDIATE MEDICAL CARE IF:   You have bright red blood in your stool.   Your constipation lasts for more than 4 days or gets worse.   You have abdominal or rectal pain.   You have thin, pencil-like stools.   You have unexplained weight loss. MAKE SURE YOU:    Understand these instructions.  Will watch your condition.  Will get help right away if you are not doing well or get worse. Document Released: 10/10/2003 Document Revised: 01/16/2013 Document Reviewed: 10/23/2012 [facility] Patient Information 2015 [place]. This information is not intended to replace advice given to you by your health care provider. Make sure you discuss any questions you have with your health care provider.

## 2013-12-07 ENCOUNTER — Encounter (HOSPITAL_COMMUNITY): Payer: Self-pay | Admitting: Internal Medicine

## 2014-06-19 ENCOUNTER — Encounter: Payer: Self-pay | Admitting: Family Medicine

## 2014-06-19 ENCOUNTER — Ambulatory Visit (INDEPENDENT_AMBULATORY_CARE_PROVIDER_SITE_OTHER): Payer: BLUE CROSS/BLUE SHIELD | Admitting: Family Medicine

## 2014-06-19 ENCOUNTER — Ambulatory Visit (HOSPITAL_COMMUNITY): Admission: RE | Admit: 2014-06-19 | Payer: BC Managed Care – PPO | Source: Ambulatory Visit

## 2014-06-19 VITALS — BP 118/72 | Ht 67.0 in | Wt 140.4 lb

## 2014-06-19 DIAGNOSIS — R61 Generalized hyperhidrosis: Secondary | ICD-10-CM

## 2014-06-19 DIAGNOSIS — J302 Other seasonal allergic rhinitis: Secondary | ICD-10-CM

## 2014-06-19 DIAGNOSIS — B9689 Other specified bacterial agents as the cause of diseases classified elsewhere: Secondary | ICD-10-CM

## 2014-06-19 DIAGNOSIS — Z13220 Encounter for screening for lipoid disorders: Secondary | ICD-10-CM | POA: Diagnosis not present

## 2014-06-19 DIAGNOSIS — J019 Acute sinusitis, unspecified: Secondary | ICD-10-CM

## 2014-06-19 MED ORDER — LEVOFLOXACIN 500 MG PO TABS: 500.0000 mg | ORAL_TABLET | Freq: Every day | ORAL | Status: DC

## 2014-06-19 NOTE — Progress Notes (Signed)
   Subjective:    Patient ID: Meghan Acosta, female    DOB: 04-Mar-1975, 39 y.o.   MRN: [id number]  HPI Patient arrives to get established. Patient would like to have screening labs and discuss sinus issues. Please see discussion below. Patient with night sweats intermittently over the past 2 weeks no good reason but she has had a sinus infection recently She denies any high fevers denies change and appetite denies nausea vomiting diarrhea. Does relate head congestion drainage coughing had one nosebleed no easy bruising no bleeding issues otherwise. Has good health in the past. Tries to eat healthy and stays physically active. 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  Review of Systems  Constitutional: Negative for fever and activity change.  HENT: Negative for congestion, ear pain and rhinorrhea.   Eyes: Negative for discharge.  Respiratory: Negative for cough, shortness of breath and wheezing.   Cardiovascular: Negative for chest pain.       Objective:   Physical Exam The nostrils showed that the septum is raw. Sinuses mild maxillary and frontal tenderness worse maxillary neck no masses no adenopathy lungs are clear no crackles heart is regular liver and spleen is normal. Extremities no edema skin warm dry       Assessment & Plan:  Allergic rhinitis OTC measures were discussed including Zyrte minimize use of decongestive when possible use Flonase on a regular basis  Sinusitis antibodies prescribed warning signs discussed follow-up if problems  Night sweats chest x-ray CBC await the results of this may need further testing follow-up in 6 weeks if still having night sweats or if worse I would highly recommend that this patient be further worked up she understands this.  Screening lab work recommendedc

## 2014-06-28 ENCOUNTER — Ambulatory Visit (HOSPITAL_COMMUNITY)
Admission: RE | Admit: 2014-06-28 | Discharge: 2014-06-28 | Disposition: A | Discharge status: Home / Self Care | Payer: BLUE CROSS/BLUE SHIELD | Source: Ambulatory Visit | Attending: Family Medicine | Admitting: Family Medicine

## 2014-06-28 ENCOUNTER — Telehealth: Payer: Self-pay | Admitting: Family Medicine

## 2014-06-28 DIAGNOSIS — R61 Generalized hyperhidrosis: Secondary | ICD-10-CM | POA: Insufficient documentation

## 2014-06-28 MED ORDER — CEFPROZIL 500 MG PO TABS: 500.0000 mg | ORAL_TABLET | Freq: Two times a day (BID) | ORAL | Status: DC

## 2014-06-28 NOTE — Telephone Encounter (Signed)
Next step - cefzil 500 bid 10 days and OTC generic Allegra 180 mg one qd, also Flonase 2 srays each nare for 30 days AND follow up ov in 10 days if not better sooner if problems

## 2014-06-28 NOTE — Telephone Encounter (Signed)
Given levaquin 06/19/14

## 2014-06-28 NOTE — Telephone Encounter (Signed)
Pt was seen 06/19/14 for head congestion, she now states her right ear Is having some fluid in it and causing her to have a hard time to hear. Stopped up, sounds like in a tunnel  Was issued an antibiotic, still feels congested   [place] [facility]

## 2014-06-28 NOTE — Telephone Encounter (Addendum)
Rx sent electronically to pharmacy. Patient notified and advised to use OTC generic Allegra 180 mg one qd, also Flonase 2 srays each nare for 30 days AND follow up ov in 10 days if not better sooner if problems. Patient verbalized understanding.

## 2014-06-29 LAB — HEPATIC FUNCTION PANEL
ALBUMIN: 4.6 g/dL (ref 3.5–5.5)
ALT: 18 IU/L (ref 0–32)
AST: 25 IU/L (ref 0–40)
Alkaline Phosphatase: 39 IU/L (ref 39–117)
Bilirubin Total: 0.5 mg/dL (ref 0.0–1.2)
Bilirubin, Direct: 0.13 mg/dL (ref 0.00–0.40)
Total Protein: 7 g/dL (ref 6.0–8.5)

## 2014-06-29 LAB — BASIC METABOLIC PANEL
BUN / CREAT RATIO: 11 (ref 8–20)
BUN: 11 mg/dL (ref 6–20)
CALCIUM: 10 mg/dL (ref 8.7–10.2)
CHLORIDE: 101 mmol/L (ref 97–108)
CO2: 24 mmol/L (ref 18–29)
CREATININE: 1.02 mg/dL — AB (ref 0.57–1.00)
GFR calc Af Amer: 81 mL/min/{1.73_m2} (ref >59)
GFR calc non Af Amer: 70 mL/min/{1.73_m2} (ref >59)
Glucose: 88 mg/dL (ref 65–99)
Potassium: 3.9 mmol/L (ref 3.5–5.2)
Sodium: 142 mmol/L (ref 134–144)

## 2014-06-29 LAB — CBC WITH DIFFERENTIAL/PLATELET
BASOS ABS: 0 10*3/uL (ref 0.0–0.2)
BASOS: 0 %
EOS (ABSOLUTE): 0 10*3/uL (ref 0.0–0.4)
EOS: 0 %
HEMATOCRIT: 37.9 % (ref 34.0–46.6)
HEMOGLOBIN: 12.7 g/dL (ref 11.1–15.9)
IMMATURE GRANULOCYTES: 0 %
Immature Grans (Abs): 0 10*3/uL (ref 0.0–0.1)
LYMPHS ABS: 3 10*3/uL (ref 0.7–3.1)
Lymphs: 68 %
MCH: 30.8 pg (ref 26.6–33.0)
MCHC: 33.5 g/dL (ref 31.5–35.7)
MCV: 92 fL (ref 79–97)
MONOCYTES: 5 %
MONOS ABS: 0.2 10*3/uL (ref 0.1–0.9)
NEUTROS ABS: 1.2 10*3/uL — AB (ref 1.4–7.0)
Neutrophils: 27 %
PLATELETS: 306 10*3/uL (ref 150–379)
RBC: 4.12 x10E6/uL (ref 3.77–5.28)
RDW: 12.9 % (ref 12.3–15.4)
WBC: 4.5 10*3/uL (ref 3.4–10.8)

## 2014-06-29 LAB — LIPID PANEL
Chol/HDL Ratio: 2.6 ratio units (ref 0.0–4.4)
Cholesterol, Total: 208 mg/dL — ABNORMAL HIGH (ref 100–199)
HDL: 81 mg/dL (ref >39)
LDL Calculated: 116 mg/dL — ABNORMAL HIGH (ref 0–99)
Triglycerides: 57 mg/dL (ref 0–149)
VLDL CHOLESTEROL CAL: 11 mg/dL (ref 5–40)

## 2014-06-30 ENCOUNTER — Encounter: Payer: Self-pay | Admitting: Family Medicine

## 2014-07-31 ENCOUNTER — Ambulatory Visit: Payer: BLUE CROSS/BLUE SHIELD | Admitting: Family Medicine

## 2014-08-19 ENCOUNTER — Ambulatory Visit (INDEPENDENT_AMBULATORY_CARE_PROVIDER_SITE_OTHER): Payer: BLUE CROSS/BLUE SHIELD | Admitting: Family Medicine

## 2014-08-19 ENCOUNTER — Encounter: Payer: Self-pay | Admitting: Family Medicine

## 2014-08-19 VITALS — BP 100/70 | Ht 67.0 in | Wt 141.0 lb

## 2014-08-19 DIAGNOSIS — J3089 Other allergic rhinitis: Secondary | ICD-10-CM

## 2014-08-19 DIAGNOSIS — R61 Generalized hyperhidrosis: Secondary | ICD-10-CM | POA: Diagnosis not present

## 2014-08-19 NOTE — Progress Notes (Signed)
   Subjective:    Patient ID: Meghan Acosta, female    DOB: 09-06-1975, 39 y.o.   MRN: [id number]  HPI Patient is here today for a follow up visit for night sweats.  Patient states that her symptoms have improved tremendously.   Patient states that she has itchy throat that has been present for over 1 month now. No other symptoms. No drainage  Patient has a cut in between her toes on the right foot. Patient states that slight pain is noted. Treatments Tried: Neosporin with no relief.   Gym goal  3 times a week Appetite good Sweats gone No bleeding issues  Review of Systems  Constitutional: Negative for activity change, appetite change and fatigue.  HENT: Negative for congestion.   Respiratory: Negative for cough.   Cardiovascular: Negative for chest pain.  Gastrointestinal: Negative for abdominal pain.  Endocrine: Negative for polydipsia and polyphagia.  Neurological: Negative for weakness.  Psychiatric/Behavioral: Negative for confusion.       Objective:   Physical Exam  Constitutional: She appears well-nourished. No distress.  Cardiovascular: Normal rate, regular rhythm and normal heart sounds.   No murmur heard. Pulmonary/Chest: Effort normal and breath sounds normal. No respiratory distress.  Musculoskeletal: She exhibits no edema.  Lymphadenopathy:    She has no cervical adenopathy.  Neurological: She is alert. She exhibits normal muscle tone.  Psychiatric: Her behavior is normal.  Vitals reviewed.         Assessment & Plan:  Generic allegra or claritrin recommended Hold flonase  Patient is doing much better. No evidence of any type underlying problem no more night sweats. Patient was told that if she starts having this again we would need to do further testing possible CAT scan she needs to watch her diet if any unusual fevers night sweats vomiting or other problems she needs follow-up immediately.  She is having what appears to be a small fungal  infection between her small toe and fourth toe she may use over-the-counter Lotrimin as needed  Moderate allergies generic Allegra when necessary

## 2014-08-19 NOTE — Patient Instructions (Signed)
Lotrimin cream btwn the toes daily prn

## 2014-10-25 ENCOUNTER — Telehealth: Payer: Self-pay | Admitting: Family Medicine

## 2014-10-25 NOTE — Telephone Encounter (Signed)
Patient called needing a referral to foot specialist. Having foot pain spoke with you on this at her last visit.

## 2014-10-25 NOTE — Telephone Encounter (Signed)
Please give referral to podiatry. Thank you. Reason for pain.

## 2014-10-28 ENCOUNTER — Other Ambulatory Visit: Payer: Self-pay | Admitting: *Deleted

## 2014-10-28 DIAGNOSIS — M25579 Pain in unspecified ankle and joints of unspecified foot: Secondary | ICD-10-CM

## 2014-10-28 NOTE — Telephone Encounter (Signed)
Referral put in. Pt notified.  

## 2014-11-07 ENCOUNTER — Ambulatory Visit (INDEPENDENT_AMBULATORY_CARE_PROVIDER_SITE_OTHER): Payer: BLUE CROSS/BLUE SHIELD | Admitting: Podiatry

## 2014-11-07 ENCOUNTER — Ambulatory Visit (INDEPENDENT_AMBULATORY_CARE_PROVIDER_SITE_OTHER): Payer: BLUE CROSS/BLUE SHIELD

## 2014-11-07 ENCOUNTER — Encounter: Payer: Self-pay | Admitting: Podiatry

## 2014-11-07 VITALS — BP 109/76 | HR 68 | Resp 12

## 2014-11-07 DIAGNOSIS — M2011 Hallux valgus (acquired), right foot: Secondary | ICD-10-CM | POA: Diagnosis not present

## 2014-11-07 NOTE — Progress Notes (Signed)
Subjective:    Patient ID: Meghan Acosta, female    DOB: 1976-01-06, 39 y.o.   MRN: [id number]  HPI: She presents today as a 39 year old female complaining of painful bunion 5 years right foot. States that he seems to be getting worse and crowding her lesser digits. 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 It is beginning to affect her ability to perform her job on a regular pain-free basis and also impacts her free time and efforts to exercise. She's also complaining of an area of reactive hyperkeratoses between the fourth and fifth digits of the right foot that is significantly painful as well. She denies any history of trauma to the right foot.    Review of Systems  Skin: Positive for color change.       Objective:   Physical Exam: 39 year old female in no apparent distress very pleasant vital signs stable alert and oriented 3 presents strong palpable pulses bilateral. Neurologic sensorium is intact per Semmes-Weinstein monofilament. Deep tendon reflexes are brisk and equal bilateral. Muscle strength +5 over 5 dorsiflexion plantar flexors and inverters everters all into the musculature is intact. Orthopedic evaluation demonstrates all joints distal to the ankle for range of motion without crepitation. She has pain on palpation of the first metatarsophalangeal joint of the right foot pain on palpation of the dorsal lateral aspect and of the joint indicating what appears to be maybe a functional hallux limitus. An obvious hallux valgus deformity with an increase in the first inner metatarsal angle and hallux abductus this resulting in crowding of her lesser digits. She is starting to develop some mild flexible hammertoe deformities on the right foot as opposed to the left. Radiographs today do demonstrate moderate hallux abductovalgus deformity with an increase in the first  intermetatarsal angle of 14 hallux abductus angle 10 with early dislocation. She has a slightly elongated first metatarsal which may be contributory to this condition. Cutaneous evaluation demonstrates supple well-hydrated cutis with exception of a reactive hyperkeratosis in the deep sulcus space between fourth and fifth digits of the right foot. I debrided this today there was no open lesions no bleeding no wound signs of purulence no signs of infection. More than likely this is coming about due to the compression of her lesser digits by her bunion deformity coupled with her shoe gear.        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 Crowding of the lesser digits with mild hammertoe deformities and a corn between the fourth and fifth toes of the right foot.  Plan: We discussed the etiology pathology conservative versus surgical therapies at this point she is requesting surgical intervention. I expressed to her that she would need to take some time off to allow this to heal she understands that and is amenable to it we consented her today for an [facility] Young's with osteotomy with screw fixation first metatarsophalangeal joint right foot. I answered all the questions regarding this procedure to the best of my ability in layman's terms. She understood it was amenable to inside out the patient of the consent form. We did discuss the possible postop complications which may include but are not limited to postop pain bleeding swelling infection recurrence need for further surgery overcorrection under correction loss of digit loss of limb loss of life. We  dispensed her Cam Walker for postop recovery. We discussed her past medical history medications allergies surgeries and social history once again and I feel that her health is adequate to perform this procedure and an outpatient facility. She will notify us with questions or  concerns and I will follow-up with her in the near future for surgical intervention.  Arbutus Ped DPM

## 2014-11-07 NOTE — Patient Instructions (Signed)
Pre-Operative Instructions  Congratulations, you have decided to take an important step to improving your quality of life.  You can be assured that the doctors of [facility] will be with you every step of the way.  1. Plan to be at the surgery center/hospital at least 1 (one) hour prior to your scheduled time unless otherwise directed by the surgical center/hospital staff.  You must have a responsible adult accompany you, remain during the surgery and drive you home.  Make sure you have directions to the surgical center/hospital and know how to get there on time. 2. For hospital based surgery you will need to obtain a history and physical form from your family physician within 1 month prior to the date of surgery- we will give you a form for you primary physician.  3. We make every effort to accommodate the date you request for surgery.  There are however, times where surgery dates or times have to be moved.  We will contact you as soon as possible if a change in schedule is required.   4. No Aspirin/Ibuprofen for one week before surgery.  If you are on aspirin, any non-steroidal anti-inflammatory medications (Mobic, Aleve, Ibuprofen) you should stop taking it 7 days prior to your surgery.  You make take Tylenol  For pain prior to surgery.  5. Medications- If you are taking daily heart and blood pressure medications, seizure, reflux, allergy, asthma, anxiety, pain or diabetes medications, make sure the surgery center/hospital is aware before the day of surgery so they may notify you which medications to take or avoid the day of surgery. 6. No food or drink after midnight the night before surgery unless directed otherwise by surgical center/hospital staff. 7. No alcoholic beverages 24 hours prior to surgery.  No smoking 24 hours prior to or 24 hours after surgery. 8. Wear loose pants or shorts- loose enough to fit over bandages, boots, and casts. 9. No slip on shoes, sneakers are best. 10. Bring  your boot with you to the surgery center/hospital.  Also bring crutches or a walker if your physician has prescribed it for you.  If you do not have this equipment, it will be provided for you after surgery. 11. If you have not been contracted by the surgery center/hospital by the day before your surgery, call to confirm the date and time of your surgery. 12. Leave-time from work may vary depending on the type of surgery you have.  Appropriate arrangements should be made prior to surgery with your employer. 13. Prescriptions will be provided immediately following surgery by your doctor.  Have these filled as soon as possible after surgery and take the medication as directed. 14. Remove nail polish on the operative foot. 15. Wash the night before surgery.  The night before surgery wash the foot and leg well with the antibacterial soap provided and water paying special attention to beneath the toenails and in between the toes.  Rinse thoroughly with water and dry well with a towel.  Perform this wash unless told not to do so by your physician.  Enclosed: 1 Ice pack (please put in freezer the night before surgery)   1 Hibiclens skin cleaner   Pre-op Instructions  If you have any questions regarding the instructions, do not hesitate to call our office.  [place]: [address] [place], [place] [address] [phone]  [place]: [address] [place], [place] [address] [phone]  [place]: [address]  [place],  [address] [phone]  Dr. Richard   Tuchman DPM, Dr. Norman Regal DPM Dr. Richard Sikora DPM, Dr. M. Todd Hyatt DPM, Dr. Kathryn Egerton DPM 

## 2014-11-08 ENCOUNTER — Encounter: Payer: Self-pay | Admitting: *Deleted

## 2014-11-15 DIAGNOSIS — M2011 Hallux valgus (acquired), right foot: Secondary | ICD-10-CM

## 2014-12-25 ENCOUNTER — Telehealth: Payer: Self-pay | Admitting: *Deleted

## 2014-12-25 NOTE — Telephone Encounter (Signed)
Pt states calling again concerning questions about her surgery on Friday 12/27/2014.

## 2014-12-25 NOTE — Telephone Encounter (Signed)
I'm returning your call.  How can I help you?  "I am having surgery on Friday.  My insurance ends this month and starts over in January.  How many visits will I have after surgery for post-op appointments?"  You will see him a week after.  You don't have to be worried about insurance.  There is no charge for post operative appointments.  "Oh, I didn't know how it worked.  I just wanted to make sure."

## 2014-12-26 ENCOUNTER — Other Ambulatory Visit: Payer: Self-pay | Admitting: Podiatry

## 2014-12-26 MED ORDER — CEPHALEXIN 500 MG PO CAPS: 500.0000 mg | ORAL_CAPSULE | Freq: Three times a day (TID) | ORAL | Status: DC

## 2014-12-26 MED ORDER — PROMETHAZINE HCL 25 MG PO TABS: 25.0000 mg | ORAL_TABLET | Freq: Three times a day (TID) | ORAL | Status: DC | PRN

## 2014-12-26 MED ORDER — OXYCODONE-ACETAMINOPHEN 10-325 MG PO TABS: ORAL_TABLET | ORAL | Status: DC

## 2014-12-27 ENCOUNTER — Telehealth: Payer: Self-pay | Admitting: Podiatry

## 2014-12-27 DIAGNOSIS — M2011 Hallux valgus (acquired), right foot: Secondary | ICD-10-CM | POA: Diagnosis not present

## 2014-12-27 NOTE — Telephone Encounter (Signed)
Pt called and said that her short term disability company([employer]) asked for her to contact us and let us know it was ok to release information to them. I told pt I would document in chart.

## 2015-01-02 ENCOUNTER — Ambulatory Visit (INDEPENDENT_AMBULATORY_CARE_PROVIDER_SITE_OTHER): Payer: BLUE CROSS/BLUE SHIELD | Admitting: Podiatry

## 2015-01-02 ENCOUNTER — Ambulatory Visit (INDEPENDENT_AMBULATORY_CARE_PROVIDER_SITE_OTHER): Payer: BLUE CROSS/BLUE SHIELD

## 2015-01-02 ENCOUNTER — Encounter: Payer: Self-pay | Admitting: Podiatry

## 2015-01-02 VITALS — BP 142/92 | HR 86 | Resp 12

## 2015-01-02 DIAGNOSIS — M2011 Hallux valgus (acquired), right foot: Secondary | ICD-10-CM

## 2015-01-02 NOTE — Progress Notes (Signed)
Meghan Acosta presents today for her first postop visit status [facility] screw fixation first metatarsophalangeal joint right foot. Date of surgery 12/27/2014. She states that she had an orthostatic hypotension event immediately following surgery while at home. She denies any trauma but states that she's been slightly nauseous over the past week or so. Admits to being on her foot more yesterday that she should have been.  Objective: Vital signs are stable she is alert and oriented 3 she presents today in her Cam Walker and dry sterile compressive dressing. Once removed demonstrates moderate edema mild erythema no cellulitis drainage or odor. She has great range of motion with minimal discomfort first metatarsophalangeal joint right foot. Radiographic evaluation demonstrates perfect alignment of the first metatarsophalangeal joint and internal fixation is in good position.  Assessment: Well-healing surgical foot status post [place] bunion repair 12/27/2014 right foot.  Plan: Redress today dry sterile compressive dressing. Instructed her on range of motion exercises. Encouraged her to keep the dressing dry and clean and he can foot elevated follow-up with her in 1 week.

## 2015-01-06 ENCOUNTER — Telehealth: Payer: Self-pay | Admitting: *Deleted

## 2015-01-06 NOTE — Telephone Encounter (Signed)
Pt states her knee hurts and ankle after elevating the surgery so much.  Pt states her dad bought an incline pillow at the medical supply store, and she has used it and now her knee and ankle hurt. I asked pt if she had any calf pain and she denied any hardness, heat or redness in the area, states the calf is actually soft.  Pt states her knee hasn't had any support.  I asked pt if she felt all of her weight had been on the back of the ankle and she stated yes.  I told pt I felt she had strained her knee with the lack of support, and at this point she could rest the leg and foot on a pillow about 3-5 inches above her hip when seated or level, but still no dangling.  I told her I would advise Dr. Al CorpusHyatt and call again.

## 2015-01-07 NOTE — Telephone Encounter (Signed)
Just use two pillows no incline.

## 2015-01-07 NOTE — Telephone Encounter (Signed)
FYI

## 2015-01-14 ENCOUNTER — Encounter: Payer: Self-pay | Admitting: Podiatry

## 2015-01-14 ENCOUNTER — Ambulatory Visit (INDEPENDENT_AMBULATORY_CARE_PROVIDER_SITE_OTHER): Payer: BLUE CROSS/BLUE SHIELD | Admitting: Podiatry

## 2015-01-14 VITALS — BP 94/63 | HR 78 | Temp 96.7°F | Resp 16

## 2015-01-14 DIAGNOSIS — Z9889 Other specified postprocedural states: Secondary | ICD-10-CM

## 2015-01-14 NOTE — Progress Notes (Signed)
She presents today a little more than 2 weeks out status post bunion repair right foot. She states that she has some tingling in her leg that radiates down to her foot more than likely from where she had an injection in her right leg. She states that she no longer takes the anti-inflammatory due to stomach but she had approximately a week ago. She states that her foot feels tight and tingly but other than that it seems to be doing better.   Objective: vital signs are stable she is alert and oriented 3. No reproducible pain from the thigh her calf. Calf is supple not warm to the touch. She has good range of motion of the first metatarsophalangeal joint right foot. Sutures were removed today margins remain well coapted.   Assessment : well-healing surgical foot right. Neuritis secondary to injection right thigh.  Plan: discussed etiology pathology conservative versus surgical therapies. I encouraged her to continue range of motion exercises. I placed her in a compression anklet and a Darco shoe today. She will wear this for the next 2 weeks. I will allow her to start washing this and soaking it daily. I recommended that she start back with her ibuprofen.

## 2015-01-21 ENCOUNTER — Telehealth: Payer: Self-pay | Admitting: Family Medicine

## 2015-01-21 ENCOUNTER — Other Ambulatory Visit: Payer: Self-pay | Admitting: *Deleted

## 2015-01-21 MED ORDER — SULFACETAMIDE SODIUM 10 % OP SOLN: OPHTHALMIC | Status: DC

## 2015-01-21 NOTE — Telephone Encounter (Signed)
Sulfacetamide 2 drops qid for 3 -5 days sent to pharm. Pt notified. If ongoing or wose needs ov.

## 2015-01-21 NOTE — Telephone Encounter (Signed)
Right before Christmas, patient woke up with her right eye red, pus in the corner and drainage. She said it cleared up on Christmas day, but the next day or so, both of the eyes started to be red, pus-filled, draining, and itchy. She wants to know if we can call something in since she just had foot surgery.  [facility]

## 2015-01-23 ENCOUNTER — Encounter: Payer: Self-pay | Admitting: Podiatry

## 2015-01-23 NOTE — Progress Notes (Signed)
DOS 12-27-14  Meghan Acosta bunionectomy right

## 2015-01-28 ENCOUNTER — Encounter: Payer: Self-pay | Admitting: Podiatry

## 2015-01-28 ENCOUNTER — Ambulatory Visit (INDEPENDENT_AMBULATORY_CARE_PROVIDER_SITE_OTHER): Payer: BLUE CROSS/BLUE SHIELD

## 2015-01-28 ENCOUNTER — Ambulatory Visit (INDEPENDENT_AMBULATORY_CARE_PROVIDER_SITE_OTHER): Payer: BLUE CROSS/BLUE SHIELD | Admitting: Podiatry

## 2015-01-28 DIAGNOSIS — Z9889 Other specified postprocedural states: Secondary | ICD-10-CM | POA: Diagnosis not present

## 2015-01-28 DIAGNOSIS — M2011 Hallux valgus (acquired), right foot: Secondary | ICD-10-CM | POA: Diagnosis not present

## 2015-01-28 NOTE — Progress Notes (Signed)
Subjective:     Patient ID: Meghan Acosta, female   DOB: 16-Sep-1975, 40 y.o.   MRN: [id number]  HPI this patient returns to the office for weeks status post of the bunion repair, right foot. She says that her foot is doing well but she does have some tingling and numbness the inside of her right foot. She presents the office wearing elastic sock as well as her wooden shoe. She does have mild pain noted at the surgical site, but this is improving daily. She presents the office for an evaluation and treatment of this condition. No calf pain is noted for this patient Review of Systems     Objective:   Physical Exam neurovascular status is intact healing to the scan is noted at the surgical site right foot. There is a generalized swelling noted to the foot. She has good range of motion of the first MPJ of the right foot dislocation is noted to the surgical site of the right foot. No evidence of any redness, swelling or related infection noted     Assessment:     S/p foot surgery     Plan:     ROV  Xrays reveal good alignment and healing.  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  Therefore I told her to return in 3 weeks to be evaluated by Dr. Al CorpusHyatt.  Meantime wear elastic sock and surgical shoe for 2 weeks and then try sneakers prior to [facility].     Helane GuntherGregory Amena Dockham DPM

## 2015-02-10 ENCOUNTER — Ambulatory Visit (INDEPENDENT_AMBULATORY_CARE_PROVIDER_SITE_OTHER): Payer: Self-pay | Admitting: Nurse Practitioner

## 2015-02-10 ENCOUNTER — Encounter: Payer: Self-pay | Admitting: Nurse Practitioner

## 2015-02-10 VITALS — BP 102/70 | Temp 98.3°F | Ht 67.0 in | Wt 143.4 lb

## 2015-02-10 DIAGNOSIS — J310 Chronic rhinitis: Secondary | ICD-10-CM

## 2015-02-10 DIAGNOSIS — J329 Chronic sinusitis, unspecified: Secondary | ICD-10-CM

## 2015-02-10 MED ORDER — AZITHROMYCIN 250 MG PO TABS: ORAL_TABLET | ORAL | Status: DC

## 2015-02-10 NOTE — Progress Notes (Signed)
Subjective:  Presents for c/o sinus symptoms x 3 weeks. Sore throat at night. Drainage in am. Sinus pressure. No fever, ear pain. Occasional cough. Green mucus. Using flonase and allegra.   Objective:   BP 102/70 mmHg  Temp(Src) 98.3 F (36.8 C) (Oral)  Ht 5\' 7"  (1.702 m)  Wt 143 lb 6 oz (65.034 kg)  BMI 22.45 kg/m2 NAD. Alert, oriented. TMs clear effusion. Pharynx injected with PND noted. Neck supple with mild anterior adenopathy. Lungs clear. Heart RRR.  Assessment: Rhinosinusitis  Plan:  Meds ordered this encounter  Medications  . azithromycin (ZITHROMAX Z-PAK) 250 MG tablet    Sig: Take 2 tablets (500 mg) on  Day 1,  followed by 1 tablet (250 mg) once daily on Days 2 through 5.    Dispense:  6 each    Refill:  0    Order Specific Question:  Supervising Provider    Answer:  Merlyn AlbertLUKING, WILLIAM S [[id number]]   Call back if worsens or persists. Gets regular preventive health physicals.

## 2015-02-10 NOTE — Patient Instructions (Signed)
Saline nasal spray followed by vaseline in the nostrils as needed

## 2015-02-13 ENCOUNTER — Encounter: Payer: Self-pay | Admitting: Podiatry

## 2015-02-13 ENCOUNTER — Ambulatory Visit (INDEPENDENT_AMBULATORY_CARE_PROVIDER_SITE_OTHER): Payer: Self-pay | Admitting: Podiatry

## 2015-02-13 ENCOUNTER — Ambulatory Visit (INDEPENDENT_AMBULATORY_CARE_PROVIDER_SITE_OTHER): Payer: Self-pay

## 2015-02-13 DIAGNOSIS — M2011 Hallux valgus (acquired), right foot: Secondary | ICD-10-CM

## 2015-02-13 DIAGNOSIS — Z9889 Other specified postprocedural states: Secondary | ICD-10-CM

## 2015-02-15 NOTE — Progress Notes (Signed)
She presents today status post [place] bunion repair with screw fixation right foot date of surgery 12/27/2014. She states this still swollen and I cannot fitted into a regular shoe as of yet.  Objective: Great range of motion of the first metatarsophalangeal joint dorsally however she is limited on plantarflexion. She has swelling across the lesser metatarsals of the right foot. Still considerable edema at this point and is not able to get into a regular shoe.  Assessment: Well-healing surgical foot right moderate edema still present.  Plan: I think over the next few days at home physical therapy will help alleviate some of the swelling. I encouraged range of motion exercises and demonstrated them to her as well as massage therapy. I will follow up with her in 1 month we will extend her out of work benefits request for another month from the date of her initial return date.

## 2015-02-21 ENCOUNTER — Telehealth: Payer: Self-pay | Admitting: *Deleted

## 2015-02-21 NOTE — Telephone Encounter (Signed)
Pt states she noticed she has so broken looking blood vessels on her heel.  I told pt that it was bruising, from the swelling on and off, and could have been when the tournique was released and the blood rushed back in breaking little blood vessels.  Pt states understanding.

## 2015-02-28 ENCOUNTER — Telehealth: Payer: Self-pay | Admitting: *Deleted

## 2015-02-28 NOTE — Telephone Encounter (Addendum)
Pt states she has an appt 03/20/2015 to be evaluated to go back to work on the 03/21/2015, but she is having trouble getting into a shoe. I told pt we recommend soft sided shoes that tie, like athletic shoes - New Balance, Asics and [employer].  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  I told pt if she needed we could write her a note at the time of her visit.  03/20/2015-PT STATES SHE WAS SEEN BY Dr. Al Corpus today and told to be out of work an additional 2 weeks and she needs her work/disability insurance contacted - [employer] [phone] x [phone] Caryn Bee pt insurance ID# [id number].  03/24/2015-PT STATES SHE CONTACTED Caryn Bee at her short-term disability and he said he had not received information from our office.  Pt request a call back.

## 2015-03-20 ENCOUNTER — Encounter: Payer: Self-pay | Admitting: Podiatry

## 2015-03-20 ENCOUNTER — Ambulatory Visit (INDEPENDENT_AMBULATORY_CARE_PROVIDER_SITE_OTHER): Payer: Self-pay | Admitting: Podiatry

## 2015-03-20 ENCOUNTER — Ambulatory Visit (INDEPENDENT_AMBULATORY_CARE_PROVIDER_SITE_OTHER): Payer: Self-pay

## 2015-03-20 VITALS — BP 100/65 | HR 96 | Resp 12

## 2015-03-20 DIAGNOSIS — M2011 Hallux valgus (acquired), right foot: Secondary | ICD-10-CM

## 2015-03-20 DIAGNOSIS — Z9889 Other specified postprocedural states: Secondary | ICD-10-CM

## 2015-03-20 NOTE — Progress Notes (Signed)
She presents today for follow-up of her bunion repair right foot date of surgery 12/27/2014. She states that she still has considerable swelling and pain with shoe gear.  Objective: Vital signs are stable she is alert and oriented 3 pulses are palpable. She has great range of motion of the first metatarsophalangeal joint does appear to have some swelling but swelling appears to be much decreased from previous notes. Radiographs do demonstrate well-healed surgical foot today with mild edema no cellulitis drainage or odor on physical exam however.  Assessment: At this point she seems to be doing very well and she should progress to regular shoe gear.  Plan: I'm going to request that she remain out of work for the next 2 weeks so she can get used to wearing regular shoes particularly the type on the shoe that she has to wear for work. I will follow-up with her in 1 month for her final release.

## 2015-03-25 NOTE — Telephone Encounter (Signed)
I faxed over her extended FMLA today

## 2015-03-28 ENCOUNTER — Telehealth: Payer: Self-pay | Admitting: *Deleted

## 2015-03-28 ENCOUNTER — Encounter: Payer: Self-pay | Admitting: *Deleted

## 2015-03-28 NOTE — Telephone Encounter (Signed)
Pt states she is returning to work next week and would need a note for her to wear special shoes to work, athletic tie and [employer] strapped shoes.  Pt request I mail to:  [address], [place], [place] [id number].  Mailed.

## 2015-04-02 ENCOUNTER — Telehealth: Payer: Self-pay | Admitting: *Deleted

## 2015-04-02 NOTE — Telephone Encounter (Signed)
Pt states she is scheduled to go back to work 04/18/2015 for training, instead of 04/03/2015 return date and follow up appt to check status 2 weeks after returning to work.  I left message telling pt to keep the 04/17/2015 appt, we could always evaluate her return to work status at a later date.

## 2015-04-15 ENCOUNTER — Encounter: Payer: Self-pay | Admitting: Podiatry

## 2015-04-15 ENCOUNTER — Ambulatory Visit (INDEPENDENT_AMBULATORY_CARE_PROVIDER_SITE_OTHER): Payer: Self-pay | Admitting: Podiatry

## 2015-04-15 ENCOUNTER — Ambulatory Visit (INDEPENDENT_AMBULATORY_CARE_PROVIDER_SITE_OTHER): Payer: Self-pay

## 2015-04-15 VITALS — BP 99/67 | HR 76 | Resp 12

## 2015-04-15 DIAGNOSIS — Z9889 Other specified postprocedural states: Secondary | ICD-10-CM

## 2015-04-15 DIAGNOSIS — M2011 Hallux valgus (acquired), right foot: Secondary | ICD-10-CM

## 2015-04-15 NOTE — Progress Notes (Signed)
She presents today for her final postop visit regarding her Meghan Acosta bunion repair right foot date of surgery 12/27/2014.  Objective: Vital signs are stable she is alert and oriented 3 she has no pain on range of motion. She has minimal edema no erythema cellulitis drainage or odor. Radiographs confirm well-healing surgical foot.  Assessment: Well-healing surgical foot right.  Plan: I will allow her to get back to work follow up with her as needed.

## 2015-04-17 ENCOUNTER — Ambulatory Visit: Payer: Self-pay | Admitting: Podiatry

## 2015-05-01 ENCOUNTER — Telehealth: Payer: Self-pay | Admitting: Family Medicine

## 2015-05-01 NOTE — Telephone Encounter (Signed)
Pt works for [employer] and they have recently been notified that their uniforms are causing the employees to have rashes. Pt is wanting to know if she needs to be seen to get a note stating that she can no longer wear her uniform. Pt states that she does have a rash but can't be certain if it came from her uniform or somewhere else. Please advise.

## 2015-05-01 NOTE — Telephone Encounter (Signed)
It is very difficult to know if this rash is or is not from her uniform. If it is in the area where the uniform constantly comes in contact with the skin and certainly it could well be from the uniform. One way to find out is to stop using the uniform. If she needs a note in order to do so please construct an appropriate note and I will be happy to sign

## 2015-05-02 NOTE — Telephone Encounter (Signed)
Letter written out for patient on corner of nurses station awaiting signature.

## 2015-05-02 NOTE — Telephone Encounter (Signed)
Letter to be mailed to patient.

## 2015-06-09 ENCOUNTER — Telehealth: Payer: Self-pay | Admitting: *Deleted

## 2015-06-09 NOTE — Telephone Encounter (Signed)
Pt states she needs to know how to get her FLMA approved.  I told pt she should contact her HR and they would give her the paperwork to bring to out office.

## 2015-09-18 ENCOUNTER — Encounter: Payer: Self-pay | Admitting: Podiatry

## 2015-09-18 ENCOUNTER — Other Ambulatory Visit: Payer: Self-pay | Admitting: Obstetrics and Gynecology

## 2015-09-18 ENCOUNTER — Ambulatory Visit: Payer: PRIVATE HEALTH INSURANCE

## 2015-09-18 ENCOUNTER — Other Ambulatory Visit (HOSPITAL_COMMUNITY)
Admission: RE | Admit: 2015-09-18 | Discharge: 2015-09-18 | Disposition: A | Discharge status: Home / Self Care | Payer: 59 | Source: Ambulatory Visit | Attending: Obstetrics and Gynecology | Admitting: Obstetrics and Gynecology

## 2015-09-18 ENCOUNTER — Ambulatory Visit (INDEPENDENT_AMBULATORY_CARE_PROVIDER_SITE_OTHER): Payer: PRIVATE HEALTH INSURANCE | Admitting: Podiatry

## 2015-09-18 DIAGNOSIS — Z1151 Encounter for screening for human papillomavirus (HPV): Secondary | ICD-10-CM | POA: Diagnosis present

## 2015-09-18 DIAGNOSIS — M779 Enthesopathy, unspecified: Secondary | ICD-10-CM

## 2015-09-18 DIAGNOSIS — Z113 Encounter for screening for infections with a predominantly sexual mode of transmission: Secondary | ICD-10-CM | POA: Diagnosis present

## 2015-09-18 DIAGNOSIS — Z01411 Encounter for gynecological examination (general) (routine) with abnormal findings: Secondary | ICD-10-CM | POA: Insufficient documentation

## 2015-09-18 DIAGNOSIS — M778 Other enthesopathies, not elsewhere classified: Secondary | ICD-10-CM

## 2015-09-18 DIAGNOSIS — M7751 Other enthesopathy of right foot: Secondary | ICD-10-CM | POA: Diagnosis not present

## 2015-09-18 DIAGNOSIS — M2011 Hallux valgus (acquired), right foot: Secondary | ICD-10-CM | POA: Diagnosis not present

## 2015-09-18 MED ORDER — METHYLPREDNISOLONE 4 MG PO TBPK: ORAL_TABLET | ORAL | 0 refills | Status: DC

## 2015-09-18 NOTE — Progress Notes (Signed)
She presents today with a chief complaint of swelling and pain to the first and second metatarsophalangeal joints of the right foot. She had surgery December 2016. She states that recently she started to be more active on the right foot and has developed some pain about the first and second metatarsophalangeal joints. She denies any trauma.  Objective: Vital signs are stable she is alert and oriented 3 pulses are palpable. No calf pain. She has tenderness on range of motion of the first metatarsophalangeal joint dorsiflexion and plantarflexion as well as the second metatarsophalangeal joint on end range of motion dorsal and plantar. Radiographs do not demonstrate any type of osseus abnormalities very nice correction first metatarsal bone right foot is noted.  Assessment: Mild capsulitis of the first and second metatarsophalangeal joints of the right foot.  Plan: Placed on a Medrol Dosepak to be followed by meloxicam and I injected the first intermetatarsal space today with Kenalog and local anesthetic deep.

## 2015-09-22 LAB — CYTOLOGY - PAP

## 2015-10-02 ENCOUNTER — Encounter: Payer: Self-pay | Admitting: Podiatry

## 2015-10-02 ENCOUNTER — Ambulatory Visit (INDEPENDENT_AMBULATORY_CARE_PROVIDER_SITE_OTHER): Payer: PRIVATE HEALTH INSURANCE | Admitting: Podiatry

## 2015-10-02 DIAGNOSIS — Z9889 Other specified postprocedural states: Secondary | ICD-10-CM

## 2015-10-02 DIAGNOSIS — M779 Enthesopathy, unspecified: Principal | ICD-10-CM

## 2015-10-02 DIAGNOSIS — M7751 Other enthesopathy of right foot: Secondary | ICD-10-CM

## 2015-10-02 DIAGNOSIS — M778 Other enthesopathies, not elsewhere classified: Secondary | ICD-10-CM

## 2015-10-02 NOTE — Progress Notes (Signed)
She presents today for follow-up of her right foot she states that the foot continues to have some pain but he seems to be doing much better.  Objective: Vital signs are stable she's alert and oriented 3. Pulses are palpable. Neurologic sensorium is intact. Deep tendon reflexes are intact. Muscle strength is normal. Orthopedic evaluation does demonstrate some limitation on plantar flexion of the toe is rectus there is some mild tenderness on palpation of the plantar forefoot however this is much decreased from previous evaluation.  Assessment: Resolving capsulitis sesamoiditis first metatarsophalangeal joint left foot.  Plan: She does not resolve 100% I feel that maybe releasing the sesamoids may be of benefit. Follow up with her as needed.

## 2015-11-11 ENCOUNTER — Other Ambulatory Visit: Payer: Self-pay | Admitting: Obstetrics and Gynecology

## 2016-01-30 ENCOUNTER — Other Ambulatory Visit: Payer: Self-pay | Admitting: Obstetrics and Gynecology

## 2016-07-29 ENCOUNTER — Telehealth: Payer: Self-pay | Admitting: Family Medicine

## 2016-07-29 DIAGNOSIS — R350 Frequency of micturition: Secondary | ICD-10-CM

## 2016-07-29 NOTE — Telephone Encounter (Signed)
More than likely patient will need to be seen by urology. If she is in this region I recommend [facility]. Sometimes it can take a while to get them with specialist. If her insurance does not require referrals she can call them directly otherwise put in referral for urinary issues thank you

## 2016-07-29 NOTE — Telephone Encounter (Signed)
Patient said that she has had urinary frequency for a while, even interrupting her sleep at night.  She said she has to go to the bathroom every few hours.  Sometimes there is cramping, but no burning or discharge.  She doesn't live around here currently and she is wanting to know if Dr. Lorin PicketScott would have to see her first or if we can refer her somewhere?

## 2016-07-29 NOTE — Telephone Encounter (Signed)
Discussed with patient.  [place] and would like us to set her up with [facility]. Referral ordered in EPIC.

## 2016-07-29 NOTE — Telephone Encounter (Signed)
Please advise 

## 2016-08-09 ENCOUNTER — Encounter: Payer: Self-pay | Admitting: Family Medicine

## 2016-08-11 ENCOUNTER — Encounter (HOSPITAL_COMMUNITY): Payer: Self-pay | Admitting: *Deleted

## 2016-08-11 ENCOUNTER — Inpatient Hospital Stay (HOSPITAL_COMMUNITY)
Admission: AD | Admit: 2016-08-11 | Discharge: 2016-08-11 | Disposition: A | Discharge status: Home / Self Care | Payer: 59 | Source: Ambulatory Visit | Attending: Obstetrics and Gynecology | Admitting: Obstetrics and Gynecology

## 2016-08-11 ENCOUNTER — Inpatient Hospital Stay (HOSPITAL_COMMUNITY): Payer: 59

## 2016-08-11 DIAGNOSIS — R1032 Left lower quadrant pain: Secondary | ICD-10-CM | POA: Insufficient documentation

## 2016-08-11 DIAGNOSIS — O26891 Other specified pregnancy related conditions, first trimester: Secondary | ICD-10-CM | POA: Diagnosis not present

## 2016-08-11 DIAGNOSIS — O26899 Other specified pregnancy related conditions, unspecified trimester: Secondary | ICD-10-CM

## 2016-08-11 DIAGNOSIS — Z3A01 Less than 8 weeks gestation of pregnancy: Secondary | ICD-10-CM | POA: Diagnosis not present

## 2016-08-11 DIAGNOSIS — R109 Unspecified abdominal pain: Secondary | ICD-10-CM | POA: Diagnosis not present

## 2016-08-11 LAB — WET PREP, GENITAL
Clue Cells Wet Prep HPF POC: NONE SEEN
Trich, Wet Prep: NONE SEEN
YEAST WET PREP: NONE SEEN

## 2016-08-11 LAB — URINALYSIS, ROUTINE W REFLEX MICROSCOPIC
Bilirubin Urine: NEGATIVE
GLUCOSE, UA: NEGATIVE mg/dL
Hgb urine dipstick: NEGATIVE
KETONES UR: NEGATIVE mg/dL
LEUKOCYTES UA: NEGATIVE
Nitrite: NEGATIVE
PH: 6 (ref 5.0–8.0)
Protein, ur: NEGATIVE mg/dL
Specific Gravity, Urine: 1.01 (ref 1.005–1.030)

## 2016-08-11 LAB — CBC
HCT: 34.5 % — ABNORMAL LOW (ref 36.0–46.0)
HEMOGLOBIN: 11.9 g/dL — AB (ref 12.0–15.0)
MCH: 31.1 pg (ref 26.0–34.0)
MCHC: 34.5 g/dL (ref 30.0–36.0)
MCV: 90.1 fL (ref 78.0–100.0)
Platelets: 254 10*3/uL (ref 150–400)
RBC: 3.83 MIL/uL — ABNORMAL LOW (ref 3.87–5.11)
RDW: 13 % (ref 11.5–15.5)
WBC: 4 10*3/uL (ref 4.0–10.5)

## 2016-08-11 LAB — POCT PREGNANCY, URINE: PREG TEST UR: POSITIVE — AB

## 2016-08-11 LAB — HCG, QUANTITATIVE, PREGNANCY: HCG, BETA CHAIN, QUANT, S: 48080 m[IU]/mL — AB (ref <5)

## 2016-08-11 NOTE — Discharge Instructions (Signed)

## 2016-08-11 NOTE — MAU Note (Signed)
Pt has been having urinary frequency & dysuria, [facility], had pos UPT & ? UTI.  Is on antibiotic.  LMP was June 6.  Having lower abd pain, is more tender on L.  Denies bleeding.

## 2016-08-11 NOTE — MAU Provider Note (Signed)
History     CSN: [id number]  Arrival date and time: 08/11/16 1409   First Provider Initiated Contact with Patient 08/11/16 1528      Chief Complaint  Patient presents with  . Abdominal Pain  . Dysuria   HPI  [name] is a 41 yo G1P0 at 6.[redacted] wks gestation by LMP presenting to MAU with complaints of known UTI and increased abdominal cramping - more on the LLQ.  She was seen yesterday at [facility], told she was pregnant, dx'd with UTI and given Macrobid 100 mg po BID x 7 days.  She started having more cramps that feel like period.  She called [facility] reporting her sx's and was told to come to MAU for ectopic evaluation.  This is an unplanned, but very desired pregnancy by patient and FOB.  Past Medical History:  Diagnosis Date  . Allergy     Past Surgical History:  Procedure Laterality Date  . COLONOSCOPY N/A 12/05/2013   Procedure: COLONOSCOPY;  Surgeon: Corbin Ade, MD;  Location: [facility];  Service: Endoscopy;  Laterality: N/A;  1045  . FOOT SURGERY    . WISDOM TOOTH EXTRACTION      Family History  Problem Relation Age of Onset  . Cervical cancer Paternal Aunt   . Colon cancer Neg Hx   . Inflammatory bowel disease Neg Hx     Social History  Substance Use Topics  . Smoking status: Never Smoker  . Smokeless tobacco: Never Used  . Alcohol use No    Allergies: No Known Allergies  Prescriptions Prior to Admission  Medication Sig Dispense Refill Last Dose  . Multiple Vitamins-Minerals (MULTI COMPLETE PO) Take by mouth.   Taking  . Prenatal MV-Min-Fe Fum-FA-DHA (PRENATAL 1 PO) Take by mouth.   Taking  . sulfacetamide (BLEPH-10) 10 % ophthalmic solution Two drops qid for 3 -5 days to affected eye 15 mL 0 Taking    Review of Systems  Constitutional: Negative.   HENT: Negative.   Eyes: Negative.   Respiratory: Negative.   Cardiovascular: Negative.   Gastrointestinal: Positive for abdominal pain.  Endocrine: Negative.   Genitourinary:  Positive for pelvic pain (L>R).  Musculoskeletal: Negative.   Skin: Negative.   Allergic/Immunologic: Negative.   Neurological: Negative.   Hematological: Negative.   Psychiatric/Behavioral: Negative.    Physical Exam   Blood pressure 116/68, pulse 79, temperature 98.1 F (36.7 C), temperature source Oral, resp. rate 16, height 5\' 7"  (1.702 m), weight 67.6 kg (149 lb), last menstrual period 06/30/2016.  Physical Exam  Constitutional: She is oriented to person, place, and time. She appears well-developed and well-nourished.  HENT:  Head: Normocephalic.  Eyes: Pupils are equal, round, and reactive to light.  Neck: Normal range of motion.  Cardiovascular: Normal rate and normal heart sounds.   Respiratory: Effort normal and breath sounds normal.  GI: Soft. Bowel sounds are normal.  Genitourinary: Vagina normal.  Genitourinary Comments: Uterus: slightly enlarged, cx; smooth, pink, no lesions, scant amt of white d/c, closed/long/firm, no CMT or friability, mild LT adnexal tenderness - no adnexal tenderness on the RT  Musculoskeletal: Normal range of motion.  Neurological: She is alert and oriented to person, place, and time. She has normal reflexes.  Skin: Skin is warm and dry.  Psychiatric: She has a normal mood and affect. Her behavior is normal. Judgment and thought content normal.    MAU Course  Procedures - none  MDM CCUA UPT CBC HCG Wet Prep  GC/CT HIV OB U/S <14 wks OB Transvaginal <14 wks *Consult with Dr. Dion Body @ 1805 - notified of patient's complaints, assessments, lab & U/S results, tx plan initiate prenatal care with [facility] - ok to d/c home, agrees with plan  Results for orders placed or performed during the hospital encounter of 08/11/16 (from the past 24 hour(s))  Urinalysis, Routine w reflex microscopic     Status: None   Collection Time: 08/11/16  2:26 PM  Result Value Ref Range   Color, Urine YELLOW YELLOW   APPearance CLEAR CLEAR   Specific Gravity,  Urine 1.010 1.005 - 1.030   pH 6.0 5.0 - 8.0   Glucose, UA NEGATIVE NEGATIVE mg/dL   Hgb urine dipstick NEGATIVE NEGATIVE   Bilirubin Urine NEGATIVE NEGATIVE   Ketones, ur NEGATIVE NEGATIVE mg/dL   Protein, ur NEGATIVE NEGATIVE mg/dL   Nitrite NEGATIVE NEGATIVE   Leukocytes, UA NEGATIVE NEGATIVE  Pregnancy, urine POC     Status: Abnormal   Collection Time: 08/11/16  2:34 PM  Result Value Ref Range   Preg Test, Ur POSITIVE (A) NEGATIVE  CBC     Status: Abnormal   Collection Time: 08/11/16  3:01 PM  Result Value Ref Range   WBC 4.0 4.0 - 10.5 K/uL   RBC 3.83 (L) 3.87 - 5.11 MIL/uL   Hemoglobin 11.9 (L) 12.0 - 15.0 g/dL   HCT 16.1 (L) 09.6 - 04.5 %   MCV 90.1 78.0 - 100.0 fL   MCH 31.1 26.0 - 34.0 pg   MCHC 34.5 30.0 - 36.0 g/dL   RDW 40.9 81.1 - 91.4 %   Platelets 254 150 - 400 K/uL  hCG, quantitative, pregnancy     Status: Abnormal   Collection Time: 08/11/16  3:01 PM  Result Value Ref Range   hCG, Beta Chain, Quant, S 48,080 (H) <5 mIU/mL  Wet prep, genital     Status: Abnormal   Collection Time: 08/11/16  3:35 PM  Result Value Ref Range   Yeast Wet Prep HPF POC NONE SEEN NONE SEEN   Trich, Wet Prep NONE SEEN NONE SEEN   Clue Cells Wet Prep HPF POC NONE SEEN NONE SEEN   WBC, Wet Prep HPF POC FEW (A) NONE SEEN   Sperm PRESENT    US Ob Comp Less 14 Wks  Result Date: 08/11/2016 CLINICAL DATA:  Abdominal pain affecting pregnancy, pelvic pain for 2 days LEFT greater than RIGHT, quantitative beta HCG = 48,080 EXAM: OBSTETRIC <14 WK [place] AND TRANSVAGINAL OB US TECHNIQUE: Both transabdominal and transvaginal ultrasound examinations were performed for complete evaluation of the gestation as well as the maternal uterus, adnexal regions, and pelvic cul-de-sac. Transvaginal technique was performed to assess early pregnancy. Transabdominal imaging is limited by inadequate bladder distention and poor acoustic window. COMPARISON:  None for this gestation FINDINGS: Intrauterine gestational sac:  Present Yolk sac:  Present Embryo:  Present Cardiac Activity: Present Heart Rate: 124  bpm CRL:  6.2  mm   6 w   3 d                  [place] EDC: 04/03/2017 Subchorionic hemorrhage:  None visualized. Maternal uterus/adnexae: Small uterine leiomyoma posteriorly near fundus 2.2 x 2.1 x 1.8 cm. RIGHT ovary not visualized question obscured by bowel. LEFT ovary measures 5.4 x 4.2 x 4.4 cm and contains a simple cyst 3.9 x 3.7 x 3.8 cm. No free pelvic fluid or other adnexal masses. IMPRESSION: Single live intrauterine gestation at 80  weeks 3 days EGA by crown-rump length. Small uterine leiomyoma 2.2 cm greatest size. LEFT ovarian cyst 3.9 cm greatest size. Nonvisualization of RIGHT ovary. Electronically Signed   By: Ulyses SouthwardMark  Boles M.D.   On: 08/11/2016 17:03   [place] Ob Transvaginal  Result Date: 08/11/2016 CLINICAL DATA:  Abdominal pain affecting pregnancy, pelvic pain for 2 days LEFT greater than RIGHT, quantitative beta HCG = 48,080 EXAM: OBSTETRIC <14 WK US AND TRANSVAGINAL OB US TECHNIQUE: Both transabdominal and transvaginal ultrasound examinations were performed for complete evaluation of the gestation as well as the maternal uterus, adnexal regions, and pelvic cul-de-sac. Transvaginal technique was performed to assess early pregnancy. Transabdominal imaging is limited by inadequate bladder distention and poor acoustic window. COMPARISON:  None for this gestation FINDINGS: Intrauterine gestational sac: Present Yolk sac:  Present Embryo:  Present Cardiac Activity: Present Heart Rate: 124  bpm CRL:  6.2  mm   6 w   3 d                  US EDC: 04/03/2017 Subchorionic hemorrhage:  None visualized. Maternal uterus/adnexae: Small uterine leiomyoma posteriorly near fundus 2.2 x 2.1 x 1.8 cm. RIGHT ovary not visualized question obscured by bowel. LEFT ovary measures 5.4 x 4.2 x 4.4 cm and contains a simple cyst 3.9 x 3.7 x 3.8 cm. No free pelvic fluid or other adnexal masses. IMPRESSION: Single live intrauterine gestation at 6  weeks 3 days EGA by crown-rump length. Small uterine leiomyoma 2.2 cm greatest size. LEFT ovarian cyst 3.9 cm greatest size. Nonvisualization of RIGHT ovary. Electronically Signed   By: Ulyses SouthwardMark  Boles M.D.   On: 08/11/2016 17:03   Assessment and Plan  Abdominal pain affecting pregnancy  - Discussed lab & U/S results - Reassurance given that pregnancy is normal at this time - Call [facility] to schedule initial prenatal interview - Safe Medications in Pregnancy list given - Advised to take Tylenol 1000 mg po every 6 hrs prn pain - Advised to stay well-hydrated by drinking at least 8 water bottles a day or until urine is pale yellow - Return to MAU for emergencies Discharge home Patient verbalized an understanding of the plan of care and agrees.   Raelyn Moraolitta Khamille Beynon, MSN, CNM 08/11/2016, 3:00 PM

## 2016-08-12 LAB — GC/CHLAMYDIA PROBE AMP (~~LOC~~) NOT AT ARMC
CHLAMYDIA, DNA PROBE: NEGATIVE
NEISSERIA GONORRHEA: NEGATIVE

## 2016-08-12 LAB — HIV ANTIBODY (ROUTINE TESTING W REFLEX): HIV SCREEN 4TH GENERATION: NONREACTIVE

## 2016-09-14 DIAGNOSIS — B977 Papillomavirus as the cause of diseases classified elsewhere: Secondary | ICD-10-CM | POA: Insufficient documentation

## 2017-06-29 ENCOUNTER — Encounter (HOSPITAL_COMMUNITY): Payer: Self-pay

## 2017-08-21 IMAGING — US US OB TRANSVAGINAL
1 series · 15 of 28 positions shown · non-contrast
Comparison: None for this gestation

CLINICAL DATA: Abdominal pain affecting pregnancy, pelvic pain for
2 days LEFT greater than RIGHT, quantitative beta HCG = 48,080

EXAM:
OBSTETRIC <14 WK US AND TRANSVAGINAL OB US
TECHNIQUE: Both transabdominal and transvaginal ultrasound examinations were
performed for complete evaluation of the gestation as well as the
maternal uterus, adnexal regions, and pelvic cul-de-sac.
Transvaginal technique was performed to assess early pregnancy.
Transabdominal imaging is limited by inadequate bladder distention
and poor acoustic window.

[Series 1: us ob transvaginal · 15 of 45 slices shown]
[im 1/45]
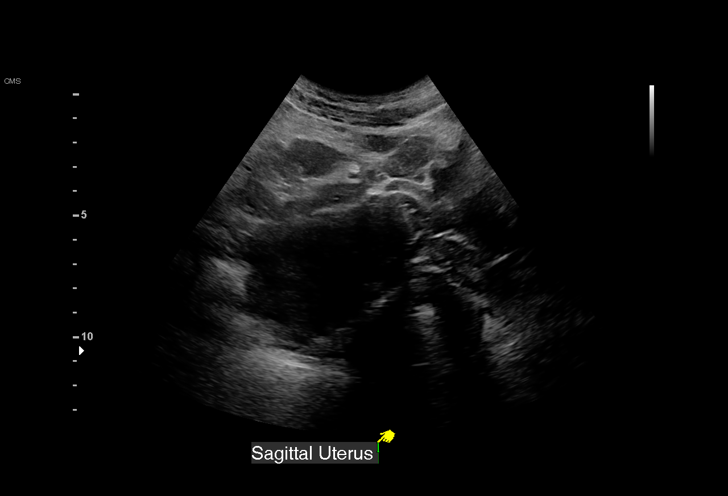
[im 4/45]
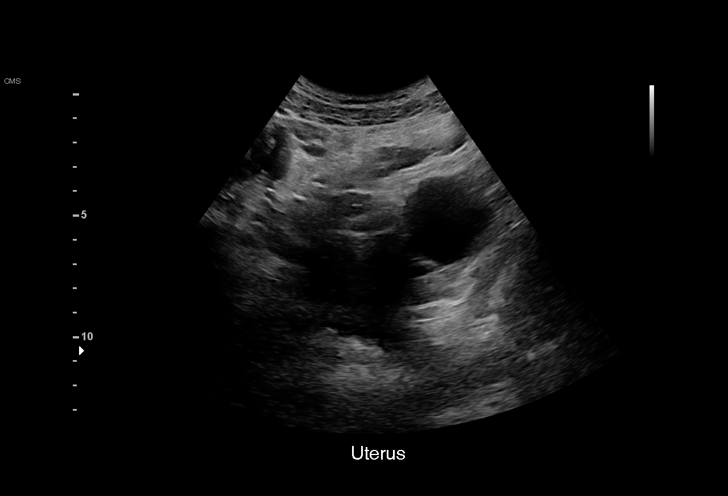
[im 7/45]
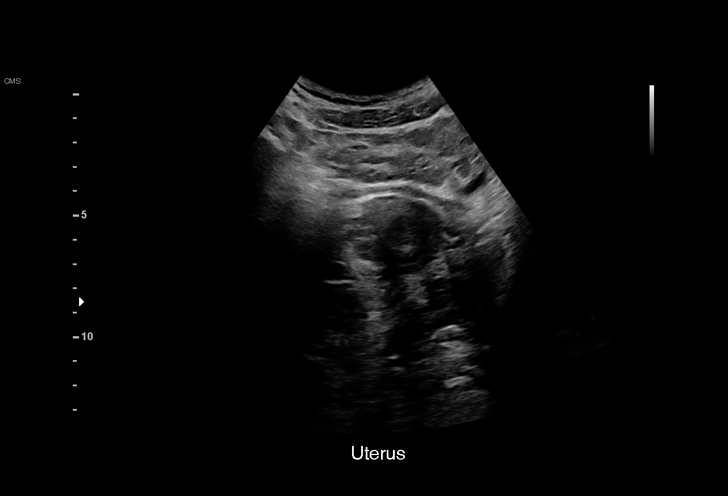
[im 10/45]
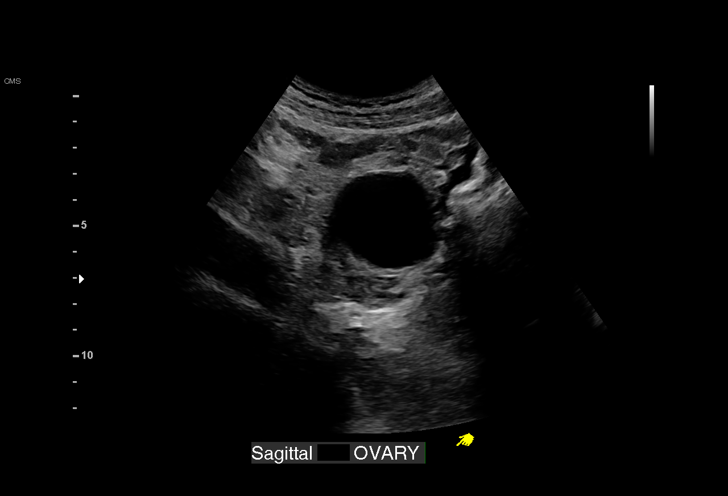
[im 14/45]
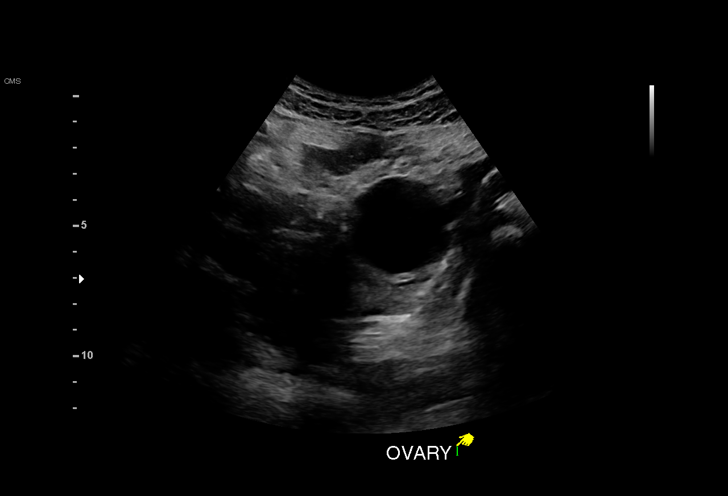
[im 17/45]
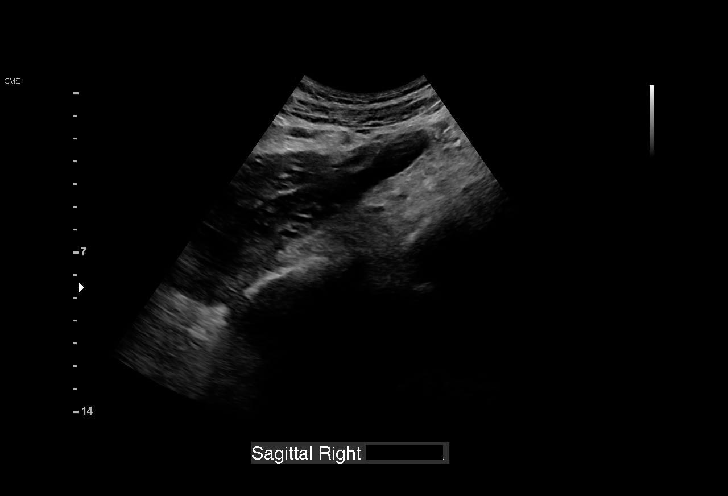
[im 20/45]
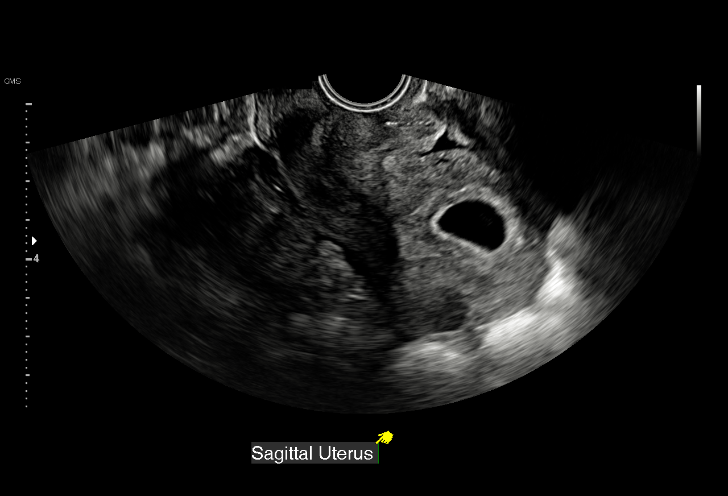
[im 23/45]
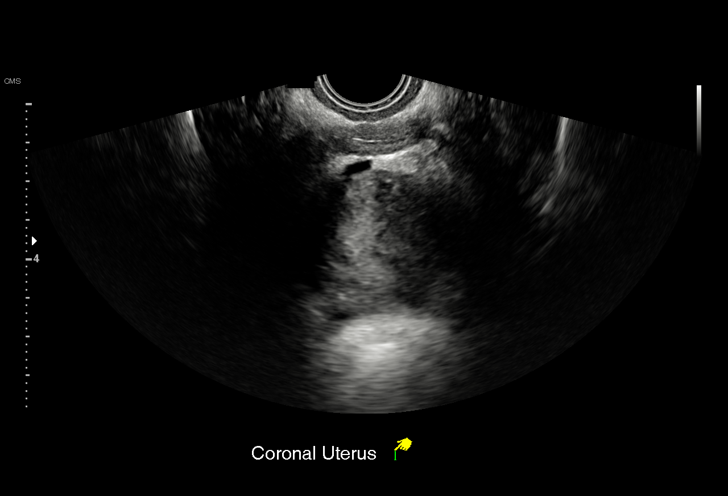
[im 25/45]
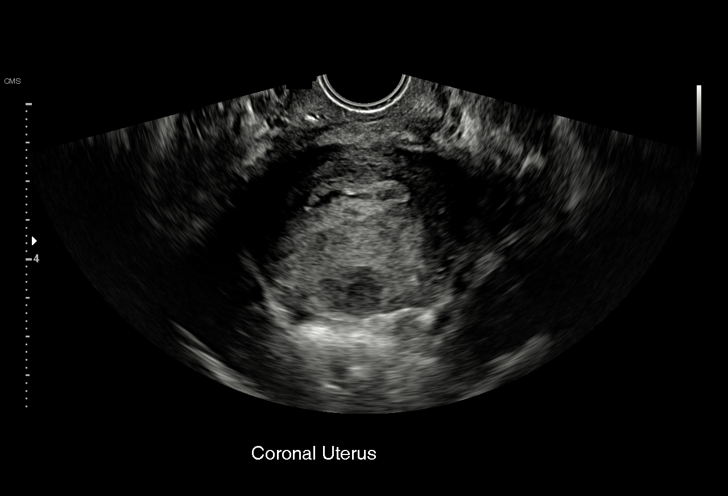
[im 28/45]
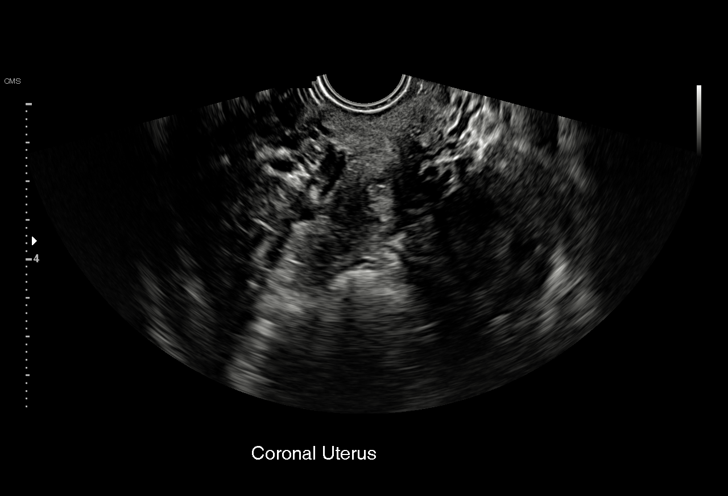
[im 31/45]
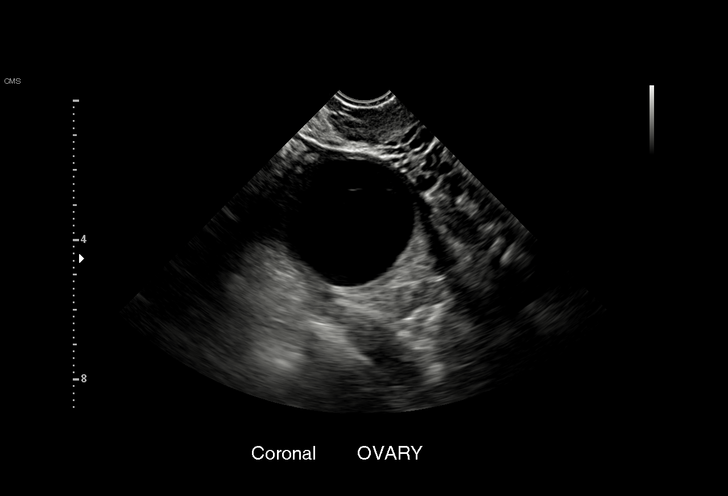
[im 35/45]
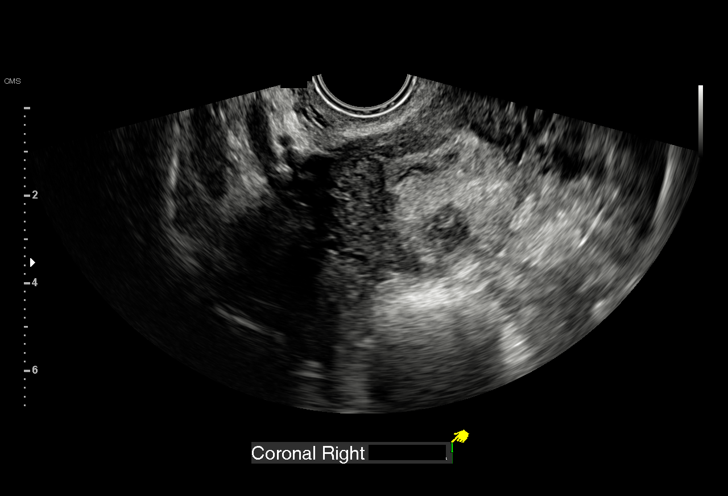
[im 38/45]
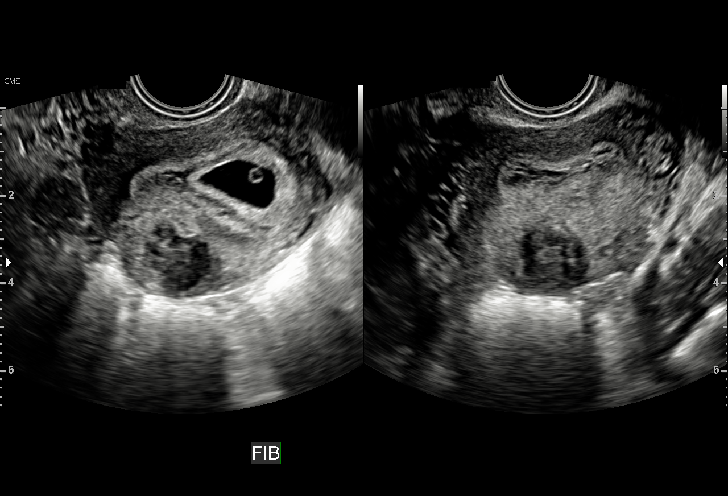
[im 41/45]
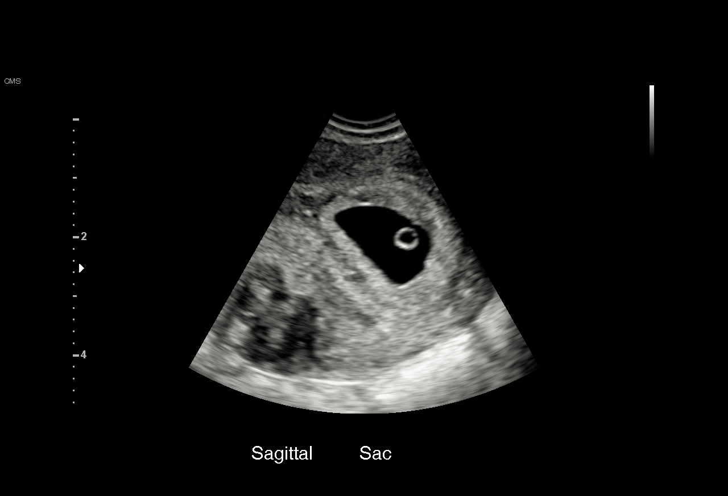
[im 45/45]
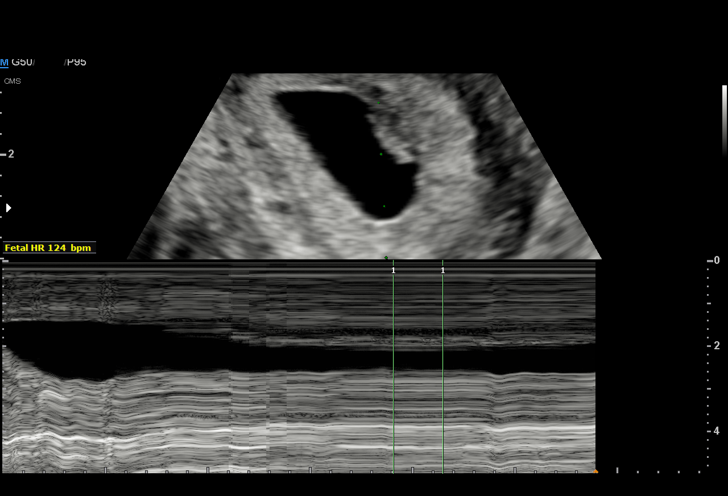

[15 of 28 positions shown; findings below may reference images not displayed]

FINDINGS: Intrauterine gestational sac: Present

Yolk sac:  Present

Embryo:  Present

Cardiac Activity: Present

Heart Rate: 124  bpm

CRL:  6.2  mm   6 w   3 d                  US EDC: 04/03/2017

Subchorionic hemorrhage:  None visualized.

Maternal uterus/adnexae:

Small uterine leiomyoma posteriorly near fundus 2.2 x 2.1 x 1.8 cm.

RIGHT ovary not visualized question obscured by bowel.

LEFT ovary measures 5.4 x 4.2 x 4.4 cm and contains a simple cyst
3.9 x 3.7 x 3.8 cm.

No free pelvic fluid or other adnexal masses.
IMPRESSION: Single live intrauterine gestation at 6 weeks 3 days EGA by
crown-rump length.

Small uterine leiomyoma 2.2 cm greatest size.

LEFT ovarian cyst 3.9 cm greatest size.

Nonvisualization of RIGHT ovary.

## 2018-12-28 ENCOUNTER — Encounter: Payer: Self-pay | Admitting: Family Medicine

## 2019-03-13 ENCOUNTER — Ambulatory Visit: Payer: PRIVATE HEALTH INSURANCE | Admitting: Podiatry

## 2019-03-20 ENCOUNTER — Ambulatory Visit: Payer: PRIVATE HEALTH INSURANCE | Admitting: Podiatry

## 2019-03-20 ENCOUNTER — Ambulatory Visit (INDEPENDENT_AMBULATORY_CARE_PROVIDER_SITE_OTHER): Payer: Managed Care, Other (non HMO)

## 2019-03-20 ENCOUNTER — Other Ambulatory Visit: Payer: Self-pay

## 2019-03-20 ENCOUNTER — Encounter: Payer: Self-pay | Admitting: Podiatry

## 2019-03-20 DIAGNOSIS — M722 Plantar fascial fibromatosis: Secondary | ICD-10-CM

## 2019-03-20 DIAGNOSIS — M7752 Other enthesopathy of left foot: Secondary | ICD-10-CM

## 2019-03-20 DIAGNOSIS — M7662 Achilles tendinitis, left leg: Secondary | ICD-10-CM | POA: Diagnosis not present

## 2019-03-20 MED ORDER — METHYLPREDNISOLONE 4 MG PO TBPK: ORAL_TABLET | ORAL | 0 refills | Status: DC

## 2019-03-20 MED ORDER — MELOXICAM 15 MG PO TABS: 15.0000 mg | ORAL_TABLET | Freq: Every day | ORAL | 3 refills | Status: DC

## 2019-03-21 NOTE — Progress Notes (Signed)
  Subjective:  Patient ID: Meghan Acosta, female    DOB: 10-Sep-1975,  MRN: [id number] HPI Chief Complaint  Patient presents with  . Foot Pain    Achilles/anterior ankle left - aching, weakness x months, tried Tylenol arthritis, used to cheer  . New Patient (Initial Visit)    44 y.o. female presents with the above complaint.   ROS: Denies fever chills nausea vomiting muscle aches pains calf pain back pain chest pain shortness of breath.  Past Medical History:  Diagnosis Date  . Allergy    Past Surgical History:  Procedure Laterality Date  . COLONOSCOPY N/A 12/05/2013   Procedure: COLONOSCOPY;  Surgeon: Corbin Ade, MD;  Location: [facility];  Service: Endoscopy;  Laterality: N/A;  1045  . FOOT SURGERY    . WISDOM TOOTH EXTRACTION      Current Outpatient Medications:  .  meloxicam (MOBIC) 15 MG tablet, Take 1 tablet (15 mg total) by mouth daily., Disp: 30 tablet, Rfl: 3 .  methylPREDNISolone (MEDROL DOSEPAK) 4 MG TBPK tablet, 6 day dose pack - take as directed, Disp: 21 tablet, Rfl: 0  No Known Allergies Review of Systems Objective:  There were no vitals filed for this visit.  General: Well developed, nourished, in no acute distress, alert and oriented x3   Dermatological: Skin is warm, dry and supple bilateral. Nails x 10 are well maintained; remaining integument appears unremarkable at this time. There are no open sores, no preulcerative lesions, no rash or signs of infection present.  Vascular: Dorsalis Pedis artery and Posterior Tibial artery pedal pulses are 2/4 bilateral with immedate capillary fill time. Pedal hair growth present. No varicosities and no lower extremity edema present bilateral.   Neruologic: Grossly intact via light touch bilateral. Vibratory intact via tuning fork bilateral. Protective threshold with Semmes Wienstein monofilament intact to all pedal sites bilateral. Patellar and Achilles deep tendon reflexes 2+ bilateral. No Babinski or clonus  noted bilateral.   Musculoskeletal: No gross boney pedal deformities bilateral. No pain, crepitus, or limitation noted with foot and ankle range of motion bilateral. Muscular strength 5/5 in all groups tested bilateral.  Gait: Unassisted, Nonantalgic.    Radiographs:  Radiographs taken today do not demonstrate any soft tissue abnormalities other than soft tissue increase in density in the anterior ankle deep to the tendons as well as Cager's triangle and the posterior aspect of the ankle between the Achilles and the deep tendon structures.  No osseous abnormalities visible.  Assessment & Plan:   Assessment: Ankle joint capsulitis  Plan: Injected the area today after sterile Betadine skin prep with 20 mg Kenalog 5 mg of Marcaine.  Also started her on meloxicam 15 mg 1 p.o. daily after methylprednisolone.  If this fails to alleviate her symptoms.  MRI will be necessary.  Follow-up with her in about 1 month to 6 weeks     Khang Hannum T. [place], [place]

## 2019-03-23 ENCOUNTER — Ambulatory Visit (HOSPITAL_COMMUNITY)
Admission: RE | Admit: 2019-03-23 | Discharge: 2019-03-23 | Disposition: A | Discharge status: Home / Self Care | Payer: Managed Care, Other (non HMO) | Source: Ambulatory Visit | Attending: Family Medicine | Admitting: Family Medicine

## 2019-03-23 ENCOUNTER — Other Ambulatory Visit: Payer: Self-pay

## 2019-03-23 ENCOUNTER — Other Ambulatory Visit: Payer: Self-pay | Admitting: Family Medicine

## 2019-03-23 ENCOUNTER — Ambulatory Visit: Payer: Managed Care, Other (non HMO) | Admitting: Family Medicine

## 2019-03-23 ENCOUNTER — Encounter: Payer: Self-pay | Admitting: Family Medicine

## 2019-03-23 VITALS — BP 114/62 | Temp 97.0°F | Wt 148.4 lb

## 2019-03-23 DIAGNOSIS — Z79899 Other long term (current) drug therapy: Secondary | ICD-10-CM

## 2019-03-23 DIAGNOSIS — Z0000 Encounter for general adult medical examination without abnormal findings: Secondary | ICD-10-CM

## 2019-03-23 DIAGNOSIS — H8390 Unspecified disease of inner ear, unspecified ear: Secondary | ICD-10-CM | POA: Diagnosis not present

## 2019-03-23 DIAGNOSIS — G542 Cervical root disorders, not elsewhere classified: Secondary | ICD-10-CM

## 2019-03-23 DIAGNOSIS — Z13220 Encounter for screening for lipoid disorders: Secondary | ICD-10-CM

## 2019-03-23 DIAGNOSIS — R253 Fasciculation: Secondary | ICD-10-CM | POA: Diagnosis not present

## 2019-03-23 DIAGNOSIS — Z114 Encounter for screening for human immunodeficiency virus [HIV]: Secondary | ICD-10-CM

## 2019-03-23 NOTE — Progress Notes (Signed)
   Subjective:    Patient ID: Meghan Acosta, female    DOB: [birth year], 44 y.o.   MRN: [id number]  Shoulder Pain  Pain location: right shoulder and down to scapula'; left shoulder. This is a new problem. Quality: burning. Associated symptoms include numbness. Treatments tried: massages once a month, Tylenol Arthrisits, muscle rub. The treatment provided mild relief.  Dizziness This is a new problem. The problem occurs intermittently. Associated symptoms include numbness.  Occasional vertigo symptoms when laying down occasionally when she is laying down and rolls over she gets of transient vertigo symptoms only last a few seconds  Twitching of thumb she has intermittent twitching of the thumb that lasts for a few seconds at a time and is been intermittent ongoing over the past several months but no weakness no burning or numbness  Burning left trapezius significant burning discomfort in the left trapezius.  Also tenderness of the right trapezius no neck pain.  Does not radiate down to the shoulder into the arm no weakness.  Denies any injury    Review of Systems  Neurological: Positive for dizziness and numbness.       Objective:   Physical Exam Decent range of motion subjective neck discomfort with flexion and rotation Tenderness right trapezius and rhomboid Subjective burning in the left trapezius Strength in arms good No twitching or tremors noted Lungs are clear respiratory rate normal heart regular no murmurs   Reflexes brisk in the left knee but otherwise all are normal    Assessment & Plan:  Intermittent inner ear symptoms occur more when she is laying down because they are intermittent and short-lived I would not recommend any medication at they become more progressive referral for ENT  Persistent left upper trapezius burning as well as tenderness in the right trapezius and rhomboid no weakness detected cervical x-rays indicated hold off on medications or MRI  currently  Twitching in the left thumb patient will give [place] feedback within the next month on the frequency of this may need neurology consultation further input I do not observe any twitching today I doubt ALS doubt MS doubt myelopathy currently

## 2019-05-01 ENCOUNTER — Other Ambulatory Visit: Payer: Self-pay

## 2019-05-01 ENCOUNTER — Encounter: Payer: Self-pay | Admitting: Podiatry

## 2019-05-01 ENCOUNTER — Ambulatory Visit: Payer: Managed Care, Other (non HMO) | Admitting: Podiatry

## 2019-05-01 DIAGNOSIS — M7662 Achilles tendinitis, left leg: Secondary | ICD-10-CM

## 2019-05-01 NOTE — Progress Notes (Signed)
She presents today for follow-up of her Achilles tendinitis states that it was about 100% better now is down to about 85%.  She states that it seems to be staying there but is much much better than it was.  Objective: Vital signs are stable she is alert oriented x3.  Ulcers are palpable.  She has some tenderness on palpation of the watershed area of the Achilles left.  Assessment: Resolving Achilles tendinitis.  Plan: I injected the area today with 2 mg of dexamethasone making sure not to inject into the tendon.  I will follow-up with her in about 6 weeks if she is not improved an MRI will be necessary.

## 2019-05-25 ENCOUNTER — Ambulatory Visit (INDEPENDENT_AMBULATORY_CARE_PROVIDER_SITE_OTHER): Payer: Managed Care, Other (non HMO) | Admitting: Nurse Practitioner

## 2019-05-25 ENCOUNTER — Other Ambulatory Visit: Payer: Self-pay

## 2019-05-25 VITALS — BP 108/68 | Temp 97.6°F | Ht 66.0 in | Wt 144.8 lb

## 2019-05-25 DIAGNOSIS — Z0000 Encounter for general adult medical examination without abnormal findings: Secondary | ICD-10-CM | POA: Diagnosis not present

## 2019-05-25 DIAGNOSIS — R8290 Unspecified abnormal findings in urine: Secondary | ICD-10-CM | POA: Diagnosis not present

## 2019-05-25 LAB — POCT URINALYSIS DIPSTICK (MANUAL)
Leukocytes, UA: NEGATIVE
Nitrite, UA: NEGATIVE
Poct Blood: NEGATIVE
Spec Grav, UA: 1.015 (ref 1.010–1.025)
pH, UA: 7 (ref 5.0–8.0)

## 2019-05-25 NOTE — Progress Notes (Signed)
Subjective:    Patient ID: Meghan Acosta, female    DOB: [birth year], 44 y.o.   MRN: [id number]  HPI Patient presents for a well woman check.    Review of Systems  Constitutional: Negative for activity change, fatigue, fever and unexpected weight change.  Respiratory: Negative for cough, chest tightness, shortness of breath and wheezing.   Cardiovascular: Negative for chest pain, palpitations and leg swelling.  Gastrointestinal: Negative for abdominal pain, constipation, diarrhea, nausea, rectal pain and vomiting.  Endocrine: Negative for cold intolerance and heat intolerance.  Genitourinary: Negative for flank pain, menstrual problem, pelvic pain, vaginal bleeding, vaginal discharge and vaginal pain.  Musculoskeletal: Negative for gait problem.       Patient reports pain in her left knee   Skin:       Patient reports "fat in back"  Neurological: Negative for dizziness, syncope, weakness, light-headedness and headaches.  Psychiatric/Behavioral: Negative for behavioral problems, sleep disturbance and suicidal ideas. The patient is not nervous/anxious.    Social: Patient reports good support system. Denies smoking, illicit drug use, or alcohol use. Sexually active with 1 partner, utilizing condoms for birth control. Reports she is up to date on vision and dental exams.    Objective:   Physical Exam Constitutional:      General: She is not in acute distress.    Appearance: Normal appearance. She is normal weight. She is not ill-appearing, toxic-appearing or diaphoretic.  Neck:     Thyroid: No thyroid mass, thyromegaly or thyroid tenderness.  Cardiovascular:     Rate and Rhythm: Normal rate and regular rhythm.     Heart sounds: Normal heart sounds, S1 normal and S2 normal.  Pulmonary:     Effort: Pulmonary effort is normal. No tachypnea or respiratory distress.     Breath sounds: Normal breath sounds.  Chest:     Breasts:        Right: No inverted nipple, mass, skin change  or tenderness.        Left: No inverted nipple, mass, skin change or tenderness.  Abdominal:     General: Abdomen is flat.     Palpations: Abdomen is soft.  Genitourinary:    Comments: Defers pelvic exam.  Musculoskeletal:        General: No swelling. Normal range of motion.     Right knee: Normal. Normal range of motion. No tenderness.     Left knee: Normal range of motion. No tenderness.     Comments: Minimal tenderness with palpation along popliteal area. No mass noted. Minimal crepitus. No joint laxity. Normal gait.   Lymphadenopathy:     Upper Body:     Right upper body: No supraclavicular, axillary or pectoral adenopathy.     Left upper body: No supraclavicular, axillary or pectoral adenopathy.  Skin:    General: Skin is warm and dry.       Neurological:     Mental Status: She is alert.  Psychiatric:        Attention and Perception: Attention and perception normal.        Mood and Affect: Mood and affect normal.        Speech: Speech normal.        Behavior: Behavior normal. Behavior is cooperative.        Thought Content: Thought content normal.        Cognition and Memory: Cognition and memory normal.        Judgment: Judgment normal.  Assessment & Plan:  Abnormal urinalysis - Plan: POCT Urinalysis Dip Manual  Well woman exam (no gynecological exam)   Patient to follow up on getting her blood work completed and then plans to get COVID vaccinations. Patient reports she will look into an IUD as well as other birth control options. Encouraged ace wrap or knee sleeve for left knee support and topical cream or OTC ibuprofen for pain relief in knee. Patient reports understanding of plan of care. Recheck if knee pain persists.  Follow up with gynecology for PAP and discussion of IUD.

## 2019-05-25 NOTE — Progress Notes (Signed)
   Subjective:    Patient ID: Meghan Acosta, female    DOB: 17-Feb-1975, 44 y.o.   MRN: [id number]  HPI  The patient comes in today for a wellness visit.    A review of their health history was completed.  A review of medications was also completed.  Any needed refills; none  Eating habits: terrible- eats 1 meal a day or snacks but is working on getting better  Falls/  MVA accidents in past few months: none  Regular exercise: walking, trying to get better  Specialist pt sees on regular basis: none  Preventative health issues were discussed.   Additional concerns: During a massage a couple months ago they mentioned a fatty tissue deposit on right side of back under bra. OB stating told patient at her last visit they found something abnormal in her urine and wanted it rechecked at annual.  Defers exam- had mammo and pap recently  Results for orders placed or performed in visit on 05/25/19  POCT Urinalysis Dip Manual  Result Value Ref Range   Spec Grav, UA 1.015 1.010 - 1.025   pH, UA 7.0 5.0 - 8.0   Leukocytes, UA Negative Negative   Nitrite, UA Negative Negative   Poct Protein ++100 (A) Negative, trace mg/dL   Poct Glucose     Poct Ketones     Poct Urobilinogen     Poct Bilirubin     Poct Blood Negative Negative, trace        Objective:   Physical Exam        Assessment & Plan:

## 2019-05-26 ENCOUNTER — Encounter: Payer: Self-pay | Admitting: Nurse Practitioner

## 2019-05-30 LAB — BASIC METABOLIC PANEL
BUN/Creatinine Ratio: 12 (ref 9–23)
BUN: 11 mg/dL (ref 6–24)
CO2: 22 mmol/L (ref 20–29)
Calcium: 9.8 mg/dL (ref 8.7–10.2)
Chloride: 103 mmol/L (ref 96–106)
Creatinine, Ser: 0.93 mg/dL (ref 0.57–1.00)
GFR calc Af Amer: 87 mL/min/{1.73_m2} (ref >59)
GFR calc non Af Amer: 76 mL/min/{1.73_m2} (ref >59)
Glucose: 77 mg/dL (ref 65–99)
Potassium: 3.8 mmol/L (ref 3.5–5.2)
Sodium: 141 mmol/L (ref 134–144)

## 2019-05-30 LAB — HEPATIC FUNCTION PANEL
ALT: 17 IU/L (ref 0–32)
AST: 18 IU/L (ref 0–40)
Albumin: 4.6 g/dL (ref 3.8–4.8)
Alkaline Phosphatase: 40 IU/L (ref 39–117)
Bilirubin Total: 0.6 mg/dL (ref 0.0–1.2)
Bilirubin, Direct: 0.16 mg/dL (ref 0.00–0.40)
Total Protein: 7 g/dL (ref 6.0–8.5)

## 2019-05-30 LAB — CBC WITH DIFFERENTIAL/PLATELET
Basophils Absolute: 0 10*3/uL (ref 0.0–0.2)
Basos: 1 %
EOS (ABSOLUTE): 0 10*3/uL (ref 0.0–0.4)
Eos: 1 %
Hematocrit: 37.1 % (ref 34.0–46.6)
Hemoglobin: 12.3 g/dL (ref 11.1–15.9)
Immature Grans (Abs): 0 10*3/uL (ref 0.0–0.1)
Immature Granulocytes: 0 %
Lymphocytes Absolute: 1.9 10*3/uL (ref 0.7–3.1)
Lymphs: 54 %
MCH: 31.1 pg (ref 26.6–33.0)
MCHC: 33.2 g/dL (ref 31.5–35.7)
MCV: 94 fL (ref 79–97)
Monocytes Absolute: 0.4 10*3/uL (ref 0.1–0.9)
Monocytes: 10 %
Neutrophils Absolute: 1.2 10*3/uL — ABNORMAL LOW (ref 1.4–7.0)
Neutrophils: 34 %
Platelets: 281 10*3/uL (ref 150–450)
RBC: 3.95 x10E6/uL (ref 3.77–5.28)
RDW: 12.2 % (ref 11.7–15.4)
WBC: 3.5 10*3/uL (ref 3.4–10.8)

## 2019-05-30 LAB — LIPID PANEL
Chol/HDL Ratio: 3.3 ratio (ref 0.0–4.4)
Cholesterol, Total: 182 mg/dL (ref 100–199)
HDL: 56 mg/dL (ref >39)
LDL Chol Calc (NIH): 115 mg/dL — ABNORMAL HIGH (ref 0–99)
Triglycerides: 55 mg/dL (ref 0–149)
VLDL Cholesterol Cal: 11 mg/dL (ref 5–40)

## 2019-05-30 LAB — HIV ANTIBODY (ROUTINE TESTING W REFLEX): HIV Screen 4th Generation wRfx: NONREACTIVE

## 2019-06-01 ENCOUNTER — Telehealth: Payer: Self-pay | Admitting: Family Medicine

## 2019-06-01 NOTE — Telephone Encounter (Signed)
Contacted patient. Pt states that her sister does not live with her. Sister tested negative. Sister was only around patient and family for a few minutes. Pt and sister was without a mask in a confined area but no more than 15 minutes. Please advise. Thank you

## 2019-06-01 NOTE — Telephone Encounter (Signed)
Does her sister live with her? How long was she with her sister? Within 6 feet without a mask or in a confined area more than 15 minutes without a mask? These are the initial questions we need to know before I can really get any good advice

## 2019-06-01 NOTE — Telephone Encounter (Signed)
Patient was around her sister 2 days ago and she found out yesterday that her sister's boyfriend tested positive for Covid and sister took a rapid test on yesterday and it was negative. She wants to know if she needs to quarantine or be tested not having any symptoms right now. She hasnt had  Her Covid shot yet.Please advise

## 2019-06-01 NOTE — Telephone Encounter (Signed)
Patient notified and verbalized understanding. 

## 2019-06-01 NOTE — Telephone Encounter (Signed)
Left message to return call 

## 2019-06-01 NOTE — Telephone Encounter (Signed)
Meghan Acosta does not have to quarantine. I would recommend staying away from the sister for at least 10 days to 14 days just in case sister is sick and does not know it Obviously if sister test positive then the patient should consider testing standard swab not rapid 5 to 7 days after exposure If any problem he can let us know

## 2019-06-12 ENCOUNTER — Encounter: Payer: Self-pay | Admitting: Podiatry

## 2019-06-12 ENCOUNTER — Other Ambulatory Visit: Payer: Self-pay

## 2019-06-12 ENCOUNTER — Ambulatory Visit: Payer: Managed Care, Other (non HMO) | Admitting: Podiatry

## 2019-06-12 DIAGNOSIS — M7662 Achilles tendinitis, left leg: Secondary | ICD-10-CM

## 2019-06-12 NOTE — Progress Notes (Signed)
She presents today for follow-up of her Achilles tendinitis left.  She states that is better than has been in a long time.  Objective: Vital signs are stable alert oriented x3.  Pulses are palpable.  No pain on palpation of the Achilles no areas of increased warmth or fluctuance.  Assessment: Normal Achilles tendon healing.  Plan: We will allow her to get back to her activities slowly and I will follow-up with her as needed.

## 2019-07-20 ENCOUNTER — Other Ambulatory Visit: Payer: Self-pay

## 2019-07-20 ENCOUNTER — Ambulatory Visit
Admission: EM | Admit: 2019-07-20 | Discharge: 2019-07-20 | Disposition: A | Discharge status: Home / Self Care | Payer: Managed Care, Other (non HMO) | Attending: Emergency Medicine | Admitting: Emergency Medicine

## 2019-07-20 DIAGNOSIS — H8110 Benign paroxysmal vertigo, unspecified ear: Secondary | ICD-10-CM | POA: Diagnosis not present

## 2019-07-20 DIAGNOSIS — R350 Frequency of micturition: Secondary | ICD-10-CM

## 2019-07-20 LAB — POCT URINE PREGNANCY: Preg Test, Ur: NEGATIVE

## 2019-07-20 LAB — POCT URINALYSIS DIP (MANUAL ENTRY)
Bilirubin, UA: NEGATIVE
Glucose, UA: NEGATIVE mg/dL
Ketones, POC UA: NEGATIVE mg/dL
Leukocytes, UA: NEGATIVE
Nitrite, UA: NEGATIVE
Protein Ur, POC: NEGATIVE mg/dL
Spec Grav, UA: 1.02 (ref 1.010–1.025)
Urobilinogen, UA: 0.2 E.U./dL
pH, UA: 5.5 (ref 5.0–8.0)

## 2019-07-20 MED ORDER — MECLIZINE HCL 25 MG PO TABS: 25.0000 mg | ORAL_TABLET | Freq: Three times a day (TID) | ORAL | 0 refills | Status: DC | PRN

## 2019-07-20 NOTE — ED Provider Notes (Signed)
[place]   [id number] 07/20/19 Arrival Time: 1239  CC: DIZZINESS  SUBJECTIVE:  Meghan Acosta is a 44 y.o. female who presents with complaint of dizziness that began few days ago.  Denies a precipitating event, trauma, or recent URI within the past month.  Describes the dizziness as "the room spinning."  States that it is intermittent with episodes lasting few minutes.  Has tried resting without relief.  Symptoms made worse with position changes.  Denies to previous symptoms.  Complains of urinary frequency.  Denies fever, chills, nausea, vomiting, hearing changes, tinnitus, ear pain, chest pain, syncope, SOB, weakness, slurred speech, memory or emotional changes, facial drooping/ asymmetry, incoordination, numbness or tingling, abdominal pain, changes in bowel habits.    ROS: As per HPI.  All other pertinent ROS negative.    Past Medical History:  Diagnosis Date  . Allergy    Past Surgical History:  Procedure Laterality Date  . COLONOSCOPY N/A 12/05/2013   Procedure: COLONOSCOPY;  Surgeon: Daneil Dolin, MD;  Location: [facility];  Service: Endoscopy;  Laterality: N/A;  1045  . FOOT SURGERY    . WISDOM TOOTH EXTRACTION     No Known Allergies No current facility-administered medications on file prior to encounter.   No current outpatient medications on file prior to encounter.   Social History   Socioeconomic History  . Marital status: Single    Spouse name: Not on file  . Number of children: 0  . Years of education: Not on file  . Highest education level: Not on file  Occupational History  . Occupation: office    Employer: [employer]  Tobacco Use  . Smoking status: Never Smoker  . Smokeless tobacco: Never Used  Substance and Sexual Activity  . Alcohol use: No  . Drug use: No  . Sexual activity: Yes  Other Topics Concern  . Not on file  Social History Narrative  . Not on file   Social Determinants of Health   Financial Resource  Strain:   . Difficulty of Paying Living Expenses:   Food Insecurity:   . Worried About Charity fundraiser in the Last Year:   . Arboriculturist in the Last Year:   Transportation Needs:   . Film/video editor (Medical):   Marland Kitchen Lack of Transportation (Non-Medical):   Physical Activity:   . Days of Exercise per Week:   . Minutes of Exercise per Session:   Stress:   . Feeling of Stress :   Social Connections:   . Frequency of Communication with Friends and Family:   . Frequency of Social Gatherings with Friends and Family:   . Attends Religious Services:   . Active Member of Clubs or Organizations:   . Attends Archivist Meetings:   Marland Kitchen Marital Status:   Intimate Partner Violence:   . Fear of Current or Ex-Partner:   . Emotionally Abused:   Marland Kitchen Physically Abused:   . Sexually Abused:    Family History  Problem Relation Age of Onset  . Cervical cancer Paternal Aunt   . Colon cancer Neg Hx   . Inflammatory bowel disease Neg Hx     OBJECTIVE:  Vitals:   07/20/19 1259  BP: 102/63  Pulse: 81  Resp: 18  Temp: 98 F (36.7 C)  SpO2: 98%    General appearance: alert; no distress Eyes: PERRLA; EOMI; conjunctiva normal HENT: normocephalic; atraumatic; TMs normal; nasal mucosa normal; oral mucosa normal Neck: supple  with FROM Lungs: clear to auscultation bilaterally Heart: regular rate and rhythm Extremities: no cyanosis or edema; symmetrical with no gross deformities Skin: warm and dry Neurologic: normal gait; CN 2-12 grossly intact; strength and sensation intact; finger to nose without difficulty; negative pronator drift Psychological: alert and cooperative; normal mood and affect  Labs:  Results for orders placed or performed during the hospital encounter of 07/20/19 (from the past 24 hour(s))  POCT urine pregnancy     Status: None   Collection Time: 07/20/19  1:08 PM  Result Value Ref Range   Preg Test, Ur Negative Negative  POCT urinalysis dipstick      Status: Abnormal   Collection Time: 07/20/19  1:25 PM  Result Value Ref Range   Color, UA yellow yellow   Clarity, UA clear clear   Glucose, UA negative negative mg/dL   Bilirubin, UA negative negative   Ketones, POC UA negative negative mg/dL   Spec Grav, UA 7.169 6.789 - 1.025   Blood, UA large (A) negative   pH, UA 5.5 5.0 - 8.0   Protein Ur, POC negative negative mg/dL   Urobilinogen, UA 0.2 0.2 or 1.0 E.U./dL   Nitrite, UA Negative Negative   Leukocytes, UA Negative Negative   ASSESSMENT & PLAN:  1. Benign paroxysmal positional vertigo, unspecified laterality   2. Urinary frequency     Meds ordered this encounter  Medications  . meclizine (ANTIVERT) 25 MG tablet    Sig: Take 1 tablet (25 mg total) by mouth 3 (three) times daily as needed for dizziness.    Dispense:  30 tablet    Refill:  0    Order Specific Question:   Supervising Provider    Answer:   Eustace Moore [[id number]]   Urine pregnancy negative.  Recheck at home in a few days if you do not start your cycle Urine does not show signs of infection Meclizine prescribed.  Take as directed for symptomatic relief.  This medication may make you drowsy so use with cautions while driving or operating heavy machinery. Follow up with PCP if symptoms persists Return, go to the ER, or call 911 if you have any new or worsening symptoms fever, chills, nausea, vomiting, chest pain, shortness of breath, weakness in arms or legs, facial droop, slurred speech, etc...  Reviewed expectations re: course of current medical issues. Questions answered. Outlined signs and symptoms indicating need for more acute intervention. Patient verbalized understanding. After Visit Summary given.    Rennis Harding, PA-C 07/20/19 1337

## 2019-07-20 NOTE — ED Triage Notes (Signed)
Pt presents with c/o dizzy spells that started a few days ago. States that occurrence is intermittent , also wants pregnancy test, period is late but having spotting

## 2019-07-20 NOTE — Discharge Instructions (Addendum)
Urine pregnancy negative.  Recheck at home in a few days if you do not start your cycle Urine does not show signs of infection Meclizine prescribed.  Take as directed for symptomatic relief.  This medication may make you drowsy so use with cautions while driving or operating heavy machinery. Follow up with PCP if symptoms persists Return, go to the ER, or call 911 if you have any new or worsening symptoms fever, chills, nausea, vomiting, chest pain, shortness of breath, weakness in arms or legs, facial droop, slurred speech, etc..Marland Kitchen

## 2020-02-21 ENCOUNTER — Other Ambulatory Visit: Payer: Self-pay

## 2020-02-21 ENCOUNTER — Telehealth (INDEPENDENT_AMBULATORY_CARE_PROVIDER_SITE_OTHER): Payer: Managed Care, Other (non HMO) | Admitting: Family Medicine

## 2020-02-21 ENCOUNTER — Telehealth: Payer: Self-pay | Admitting: *Deleted

## 2020-02-21 DIAGNOSIS — R0982 Postnasal drip: Secondary | ICD-10-CM

## 2020-02-21 DIAGNOSIS — J3489 Other specified disorders of nose and nasal sinuses: Secondary | ICD-10-CM

## 2020-02-21 MED ORDER — IPRATROPIUM BROMIDE 0.03 % NA SOLN: 2.0000 | Freq: Two times a day (BID) | NASAL | 12 refills | Status: DC

## 2020-02-21 NOTE — Telephone Encounter (Signed)
Ms. alaia, lordi are scheduled for a virtual visit with your provider today.    Just as we do with appointments in the office, we must obtain your consent to participate.  Your consent will be active for this visit and any virtual visit you may have with one of our providers in the next 365 days.    If you have a MyChart account, I can also send a copy of this consent to you electronically.  All virtual visits are billed to your insurance company just like a traditional visit in the office.  As this is a virtual visit, video technology does not allow for your provider to perform a traditional examination.  This may limit your provider's ability to fully assess your condition.  If your provider identifies any concerns that need to be evaluated in person or the need to arrange testing such as labs, EKG, etc, we will make arrangements to do so.    Although advances in technology are sophisticated, we cannot ensure that it will always work on either your end or our end.  If the connection with a video visit is poor, we may have to switch to a telephone visit.  With either a video or telephone visit, we are not always able to ensure that we have a secure connection.   I need to obtain your verbal consent now.   Are you willing to proceed with your visit today?   APPOLONIA ACKERT has provided verbal consent on 02/21/2020 for a virtual visit (video or telephone).

## 2020-02-21 NOTE — Progress Notes (Signed)
   Subjective:    Patient ID: Meghan Acosta, female    DOB: 1975/10/28, 45 y.o.   MRN: [id number]  Sinus Problem This is a new problem. The current episode started 1 to 4 weeks ago. Associated symptoms include congestion and coughing. (Drainage and tickle)  Persistent head congestion drainage denies high fever chills sweats does not feel with Covid its been going on for weeks back in December it was having discolored drainage no longer having discolored drainage  Review of Systems  HENT: Positive for congestion.   Respiratory: Positive for cough.        Objective:   Physical Exam Today's visit was via telephone Physical exam was not possible for this visit  Virtual Visit via Video Note  I connected with Meghan Acosta on 02/21/20 at 11:00 AM EST by a video enabled telemedicine application and verified that I am speaking with the correct person using two identifiers.  Location: Patient: home Provider: office   I discussed the limitations of evaluation and management by telemedicine and the availability of in person appointments. The patient expressed understanding and agreed to proceed.  History of Present Illness:    Observations/Objective:   Assessment and Plan:   Follow Up Instructions:    I discussed the assessment and treatment plan with the patient. The patient was provided an opportunity to ask questions and all were answered. The patient agreed with the plan and demonstrated an understanding of the instructions.   The patient was advised to call back or seek an in-person evaluation if the symptoms worsen or if the condition fails to improve as anticipated.  I provided 20 time spent with patient and documentation minutes of non-face-to-face time during this encounter.    She had blood in the mucus up on 1 morning I told her if her symptoms worsen or if they state persistent she should follow-up with [place] in the office or call us and we can help set up an  ENT consultation      Assessment & Plan:  Sinus postnasal drip May have had an infection back in December but no longer No need to do Covid testing Recommend Atrovent nasal spray to see if this will help with the postnasal drainage Recommend humidifier use and notify us if any ongoing troubles

## 2020-02-26 ENCOUNTER — Ambulatory Visit: Payer: Managed Care, Other (non HMO) | Admitting: Podiatry

## 2020-03-06 ENCOUNTER — Ambulatory Visit: Payer: Managed Care, Other (non HMO) | Admitting: Podiatry

## 2020-03-11 ENCOUNTER — Ambulatory Visit: Payer: Managed Care, Other (non HMO) | Admitting: Podiatry

## 2020-03-18 ENCOUNTER — Encounter: Payer: Self-pay | Admitting: Podiatry

## 2020-03-18 ENCOUNTER — Other Ambulatory Visit: Payer: Self-pay

## 2020-03-18 ENCOUNTER — Ambulatory Visit: Payer: Managed Care, Other (non HMO) | Admitting: Podiatry

## 2020-03-18 DIAGNOSIS — M7672 Peroneal tendinitis, left leg: Secondary | ICD-10-CM | POA: Diagnosis not present

## 2020-03-18 DIAGNOSIS — M7662 Achilles tendinitis, left leg: Secondary | ICD-10-CM

## 2020-03-18 MED ORDER — MELOXICAM 15 MG PO TABS: 15.0000 mg | ORAL_TABLET | Freq: Every day | ORAL | 3 refills | Status: DC

## 2020-03-18 MED ORDER — METHYLPREDNISOLONE 4 MG PO TBPK: ORAL_TABLET | ORAL | 0 refills | Status: DC

## 2020-03-18 MED ORDER — DEXAMETHASONE SODIUM PHOSPHATE 120 MG/30ML IJ SOLN
2.0000 mg | Freq: Once | INTRAMUSCULAR | Status: AC
  Administered 2020-03-18: 2 mg via INTRA_ARTICULAR

## 2020-03-18 NOTE — Progress Notes (Signed)
She presents today chief complaint of pain right here she points to the peroneal tendons posterior and superior to the lateral malleolus left.  She states that the Achilles is somewhat tender.  States that all this got flared up for physical therapy for her knee.  Objective: Vital signs are stable alert oriented x3 there is no erythema edema to left index she has tenderness on palpation of the peroneal's deep along the posterior fibula.  She has some fluctuance around the insertion of the Achilles but the whole ankle is mildly edematous.  Assessment: Peroneal tendinitis insertional Achilles tendinitis.  Plan: I injected dexamethasone to the point of maximal tenderness of the peroneal's.  Start her on a Medrol Dosepak to be followed by meloxicam.  And I will follow-up with her as needed.

## 2020-04-29 ENCOUNTER — Ambulatory Visit: Payer: Managed Care, Other (non HMO) | Admitting: Podiatry

## 2020-07-29 ENCOUNTER — Ambulatory Visit (INDEPENDENT_AMBULATORY_CARE_PROVIDER_SITE_OTHER): Payer: BC Managed Care – PPO | Admitting: Podiatry

## 2020-07-29 ENCOUNTER — Other Ambulatory Visit: Payer: Self-pay

## 2020-07-29 ENCOUNTER — Encounter: Payer: Self-pay | Admitting: Podiatry

## 2020-07-29 DIAGNOSIS — M66872 Spontaneous rupture of other tendons, left ankle and foot: Secondary | ICD-10-CM

## 2020-07-29 DIAGNOSIS — M7662 Achilles tendinitis, left leg: Secondary | ICD-10-CM | POA: Diagnosis not present

## 2020-07-29 DIAGNOSIS — M7752 Other enthesopathy of left foot: Secondary | ICD-10-CM

## 2020-07-29 DIAGNOSIS — S86012D Strain of left Achilles tendon, subsequent encounter: Secondary | ICD-10-CM | POA: Diagnosis not present

## 2020-07-30 NOTE — Progress Notes (Signed)
Meghan Acosta presents today for follow-up of her peroneal tendinitis Achilles tendinitis left foot.  States that even the front of the foot is starting to hurt now.  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  She saw a trainer at the time went underwent physical therapy and daily wrappings for practice and for games.  She states that since that time she has had pain on and off in his left ankle and foot.  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  Objective: Vital signs are stable she is alert and oriented x3.  She has some mild tenderness on palpation medial calcaneal tubercle she has the majority of her tenderness on palpation of the peroneal's and posterior tibial tendons.  She also has tenderness on palpation of the all of the extensors in the anterior ankle and tenderness on range of motion of the ankle itself.  There is no crepitation noted in this area.  But her Achilles is painful.  Radiographs demonstrate no osseous abnormalities in these areas.  Soft tissue swelling is noted.  Assessment: Ankle pain associated with the joint as well as the top soft tissues and tendons.  Cannot rule out tears of the posterior tibial tendon anterior talofibular ligament peroneal tendons and even the Achilles tendon.  Cannot rule out osteochondral defect at this point.  Plan: We are requesting MRI for additional evaluation and surgical consideration.

## 2020-08-18 ENCOUNTER — Other Ambulatory Visit: Payer: PRIVATE HEALTH INSURANCE

## 2020-09-11 ENCOUNTER — Other Ambulatory Visit: Payer: Self-pay

## 2020-09-11 ENCOUNTER — Ambulatory Visit
Admission: RE | Admit: 2020-09-11 | Discharge: 2020-09-11 | Disposition: A | Discharge status: Home / Self Care | Payer: BC Managed Care – PPO | Source: Ambulatory Visit | Attending: Podiatry | Admitting: Podiatry

## 2020-09-11 DIAGNOSIS — M25472 Effusion, left ankle: Secondary | ICD-10-CM | POA: Diagnosis not present

## 2020-09-11 DIAGNOSIS — S86012D Strain of left Achilles tendon, subsequent encounter: Secondary | ICD-10-CM

## 2020-09-11 DIAGNOSIS — M25572 Pain in left ankle and joints of left foot: Secondary | ICD-10-CM | POA: Diagnosis not present

## 2020-09-11 DIAGNOSIS — M66872 Spontaneous rupture of other tendons, left ankle and foot: Secondary | ICD-10-CM

## 2020-09-18 ENCOUNTER — Telehealth: Payer: Self-pay | Admitting: *Deleted

## 2020-09-18 NOTE — Telephone Encounter (Signed)
-----   Message from Elinor Parkinson, [place] sent at 09/17/2020  6:45 AM EDT ----- Currently the MRI is negative.  I would like to ask for an overread.  Please inform the patient of the delay.

## 2020-09-23 NOTE — Telephone Encounter (Signed)
MRI disc requested from [facility] today 

## 2020-09-25 DIAGNOSIS — M79641 Pain in right hand: Secondary | ICD-10-CM | POA: Diagnosis not present

## 2020-09-25 DIAGNOSIS — M79642 Pain in left hand: Secondary | ICD-10-CM | POA: Diagnosis not present

## 2020-09-30 NOTE — Telephone Encounter (Signed)
Received MRI disc from [facility]-sent out to overread services today 

## 2020-10-10 NOTE — Telephone Encounter (Signed)
Called Overread Services today, they are hoping to have report ready by Tuesday.

## 2020-10-20 ENCOUNTER — Encounter: Payer: Self-pay | Admitting: *Deleted

## 2020-10-20 NOTE — Telephone Encounter (Signed)
Received Overread report. Delydia wil scan in and send to Dr. Al Corpus for review

## 2020-10-28 ENCOUNTER — Ambulatory Visit: Payer: BC Managed Care – PPO | Admitting: Podiatry

## 2020-11-04 DIAGNOSIS — Z1231 Encounter for screening mammogram for malignant neoplasm of breast: Secondary | ICD-10-CM | POA: Diagnosis not present

## 2020-11-04 DIAGNOSIS — Z136 Encounter for screening for cardiovascular disorders: Secondary | ICD-10-CM | POA: Diagnosis not present

## 2020-11-04 DIAGNOSIS — R5383 Other fatigue: Secondary | ICD-10-CM | POA: Diagnosis not present

## 2020-11-04 DIAGNOSIS — Z23 Encounter for immunization: Secondary | ICD-10-CM | POA: Diagnosis not present

## 2020-11-04 DIAGNOSIS — Z1159 Encounter for screening for other viral diseases: Secondary | ICD-10-CM | POA: Diagnosis not present

## 2020-11-10 DIAGNOSIS — M25531 Pain in right wrist: Secondary | ICD-10-CM | POA: Diagnosis not present

## 2020-11-10 DIAGNOSIS — M79641 Pain in right hand: Secondary | ICD-10-CM | POA: Diagnosis not present

## 2020-11-11 DIAGNOSIS — E785 Hyperlipidemia, unspecified: Secondary | ICD-10-CM | POA: Diagnosis not present

## 2020-11-11 DIAGNOSIS — Z0000 Encounter for general adult medical examination without abnormal findings: Secondary | ICD-10-CM | POA: Diagnosis not present

## 2020-11-11 DIAGNOSIS — E559 Vitamin D deficiency, unspecified: Secondary | ICD-10-CM | POA: Diagnosis not present

## 2020-11-11 DIAGNOSIS — R7303 Prediabetes: Secondary | ICD-10-CM | POA: Diagnosis not present

## 2020-11-25 ENCOUNTER — Ambulatory Visit: Payer: BC Managed Care – PPO | Admitting: Podiatry

## 2020-11-25 DIAGNOSIS — M25531 Pain in right wrist: Secondary | ICD-10-CM | POA: Diagnosis not present

## 2020-11-25 DIAGNOSIS — Z1231 Encounter for screening mammogram for malignant neoplasm of breast: Secondary | ICD-10-CM | POA: Diagnosis not present

## 2020-11-25 DIAGNOSIS — M79641 Pain in right hand: Secondary | ICD-10-CM | POA: Diagnosis not present

## 2020-12-10 DIAGNOSIS — R7989 Other specified abnormal findings of blood chemistry: Secondary | ICD-10-CM | POA: Diagnosis not present

## 2020-12-11 ENCOUNTER — Other Ambulatory Visit: Payer: Self-pay

## 2020-12-11 ENCOUNTER — Encounter: Payer: Self-pay | Admitting: Podiatry

## 2020-12-11 ENCOUNTER — Ambulatory Visit: Payer: BC Managed Care – PPO | Admitting: Podiatry

## 2020-12-11 DIAGNOSIS — M7752 Other enthesopathy of left foot: Secondary | ICD-10-CM | POA: Diagnosis not present

## 2020-12-11 MED ORDER — TRIAMCINOLONE ACETONIDE 40 MG/ML IJ SUSP
20.0000 mg | Freq: Once | INTRAMUSCULAR | Status: AC
  Administered 2020-12-11: 14:00:00 20 mg

## 2020-12-14 NOTE — Progress Notes (Signed)
She presents today states that my ankle still hurts that she refers to the left foot.  States that it really hurts right back and here she points deep to the Achilles in the back of the ankle and in front of the ankle.  She states that there is no new trauma and just not any better.  She states that it really hurts when she points her foot.  Objective: Vital signs are stable she is alert and oriented x3.  There is no erythema edema cellulitis drainage or odor though she does have pain on sharp plantarflexion of the posterior aspect of the subtalar joint area and tenderness in the anterior ankle.  There is no fluctuance in the ankle joint.  The ankle is stable.  I reviewed the old radiographs today and they does demonstrate a small fluffy area on the posterior aspect of the posterior lateral process of the talus.  Possible impingement syndrome query.  MRI was completely negative.  Small posterior subtalar joint effusion  Assessment query of posterior tibial impingement syndrome  Plan: At this point I injected her posterior subtalar joint area.  I like to follow-up with her in 6 weeks.  Injected her with 20 mg Kenalog 5 mg Marcaine

## 2021-02-05 ENCOUNTER — Ambulatory Visit: Payer: BC Managed Care – PPO | Admitting: Podiatry

## 2021-03-02 DIAGNOSIS — S233XXA Sprain of ligaments of thoracic spine, initial encounter: Secondary | ICD-10-CM | POA: Diagnosis not present

## 2021-03-02 DIAGNOSIS — M9902 Segmental and somatic dysfunction of thoracic region: Secondary | ICD-10-CM | POA: Diagnosis not present

## 2021-03-02 DIAGNOSIS — M9903 Segmental and somatic dysfunction of lumbar region: Secondary | ICD-10-CM | POA: Diagnosis not present

## 2021-03-02 DIAGNOSIS — M5412 Radiculopathy, cervical region: Secondary | ICD-10-CM | POA: Diagnosis not present

## 2021-03-02 DIAGNOSIS — M5442 Lumbago with sciatica, left side: Secondary | ICD-10-CM | POA: Diagnosis not present

## 2021-03-02 DIAGNOSIS — M9901 Segmental and somatic dysfunction of cervical region: Secondary | ICD-10-CM | POA: Diagnosis not present

## 2021-03-10 ENCOUNTER — Ambulatory Visit: Payer: BC Managed Care – PPO | Admitting: Podiatry

## 2021-03-23 DIAGNOSIS — M25572 Pain in left ankle and joints of left foot: Secondary | ICD-10-CM | POA: Diagnosis not present

## 2021-03-23 DIAGNOSIS — J019 Acute sinusitis, unspecified: Secondary | ICD-10-CM | POA: Diagnosis not present

## 2021-04-08 DIAGNOSIS — R0981 Nasal congestion: Secondary | ICD-10-CM | POA: Diagnosis not present

## 2021-04-08 DIAGNOSIS — R093 Abnormal sputum: Secondary | ICD-10-CM | POA: Diagnosis not present

## 2021-04-08 DIAGNOSIS — R7301 Impaired fasting glucose: Secondary | ICD-10-CM | POA: Diagnosis not present

## 2021-04-08 DIAGNOSIS — Z7689 Persons encountering health services in other specified circumstances: Secondary | ICD-10-CM | POA: Diagnosis not present

## 2021-04-08 DIAGNOSIS — R5383 Other fatigue: Secondary | ICD-10-CM | POA: Diagnosis not present

## 2021-04-08 DIAGNOSIS — J019 Acute sinusitis, unspecified: Secondary | ICD-10-CM | POA: Diagnosis not present

## 2021-04-08 DIAGNOSIS — Z0000 Encounter for general adult medical examination without abnormal findings: Secondary | ICD-10-CM | POA: Diagnosis not present

## 2021-04-08 DIAGNOSIS — E785 Hyperlipidemia, unspecified: Secondary | ICD-10-CM | POA: Diagnosis not present

## 2021-04-08 DIAGNOSIS — R070 Pain in throat: Secondary | ICD-10-CM | POA: Diagnosis not present

## 2021-04-14 DIAGNOSIS — J309 Allergic rhinitis, unspecified: Secondary | ICD-10-CM | POA: Diagnosis not present

## 2021-04-14 DIAGNOSIS — E559 Vitamin D deficiency, unspecified: Secondary | ICD-10-CM | POA: Diagnosis not present

## 2021-04-14 DIAGNOSIS — E785 Hyperlipidemia, unspecified: Secondary | ICD-10-CM | POA: Diagnosis not present

## 2021-04-20 DIAGNOSIS — M5442 Lumbago with sciatica, left side: Secondary | ICD-10-CM | POA: Diagnosis not present

## 2021-04-20 DIAGNOSIS — M9901 Segmental and somatic dysfunction of cervical region: Secondary | ICD-10-CM | POA: Diagnosis not present

## 2021-04-20 DIAGNOSIS — M5412 Radiculopathy, cervical region: Secondary | ICD-10-CM | POA: Diagnosis not present

## 2021-04-20 DIAGNOSIS — S233XXA Sprain of ligaments of thoracic spine, initial encounter: Secondary | ICD-10-CM | POA: Diagnosis not present

## 2021-04-27 DIAGNOSIS — M5442 Lumbago with sciatica, left side: Secondary | ICD-10-CM | POA: Diagnosis not present

## 2021-04-27 DIAGNOSIS — M5412 Radiculopathy, cervical region: Secondary | ICD-10-CM | POA: Diagnosis not present

## 2021-04-27 DIAGNOSIS — M9901 Segmental and somatic dysfunction of cervical region: Secondary | ICD-10-CM | POA: Diagnosis not present

## 2021-04-27 DIAGNOSIS — S233XXA Sprain of ligaments of thoracic spine, initial encounter: Secondary | ICD-10-CM | POA: Diagnosis not present

## 2021-04-29 DIAGNOSIS — Z113 Encounter for screening for infections with a predominantly sexual mode of transmission: Secondary | ICD-10-CM | POA: Diagnosis not present

## 2021-04-29 DIAGNOSIS — Z130 Encounter for screening for diseases of the blood and blood-forming organs and certain disorders involving the immune mechanism: Secondary | ICD-10-CM | POA: Diagnosis not present

## 2021-04-29 DIAGNOSIS — R87610 Atypical squamous cells of undetermined significance on cytologic smear of cervix (ASC-US): Secondary | ICD-10-CM | POA: Diagnosis not present

## 2021-04-29 DIAGNOSIS — Z124 Encounter for screening for malignant neoplasm of cervix: Secondary | ICD-10-CM | POA: Diagnosis not present

## 2021-04-29 DIAGNOSIS — N898 Other specified noninflammatory disorders of vagina: Secondary | ICD-10-CM | POA: Diagnosis not present

## 2021-04-29 DIAGNOSIS — Z01411 Encounter for gynecological examination (general) (routine) with abnormal findings: Secondary | ICD-10-CM | POA: Diagnosis not present

## 2021-04-29 DIAGNOSIS — N926 Irregular menstruation, unspecified: Secondary | ICD-10-CM | POA: Diagnosis not present

## 2021-04-29 DIAGNOSIS — Z1151 Encounter for screening for human papillomavirus (HPV): Secondary | ICD-10-CM | POA: Diagnosis not present

## 2021-04-29 DIAGNOSIS — Z202 Contact with and (suspected) exposure to infections with a predominantly sexual mode of transmission: Secondary | ICD-10-CM | POA: Diagnosis not present

## 2021-06-15 DIAGNOSIS — S233XXA Sprain of ligaments of thoracic spine, initial encounter: Secondary | ICD-10-CM | POA: Diagnosis not present

## 2021-06-15 DIAGNOSIS — M5442 Lumbago with sciatica, left side: Secondary | ICD-10-CM | POA: Diagnosis not present

## 2021-06-15 DIAGNOSIS — M9901 Segmental and somatic dysfunction of cervical region: Secondary | ICD-10-CM | POA: Diagnosis not present

## 2021-06-15 DIAGNOSIS — M5412 Radiculopathy, cervical region: Secondary | ICD-10-CM | POA: Diagnosis not present

## 2021-07-03 DIAGNOSIS — M5412 Radiculopathy, cervical region: Secondary | ICD-10-CM | POA: Diagnosis not present

## 2021-07-03 DIAGNOSIS — M5442 Lumbago with sciatica, left side: Secondary | ICD-10-CM | POA: Diagnosis not present

## 2021-07-03 DIAGNOSIS — M9901 Segmental and somatic dysfunction of cervical region: Secondary | ICD-10-CM | POA: Diagnosis not present

## 2021-07-03 DIAGNOSIS — S233XXA Sprain of ligaments of thoracic spine, initial encounter: Secondary | ICD-10-CM | POA: Diagnosis not present

## 2021-07-06 DIAGNOSIS — M5412 Radiculopathy, cervical region: Secondary | ICD-10-CM | POA: Diagnosis not present

## 2021-07-06 DIAGNOSIS — M5442 Lumbago with sciatica, left side: Secondary | ICD-10-CM | POA: Diagnosis not present

## 2021-07-06 DIAGNOSIS — M9901 Segmental and somatic dysfunction of cervical region: Secondary | ICD-10-CM | POA: Diagnosis not present

## 2021-07-06 DIAGNOSIS — S233XXA Sprain of ligaments of thoracic spine, initial encounter: Secondary | ICD-10-CM | POA: Diagnosis not present

## 2021-08-06 DIAGNOSIS — M5412 Radiculopathy, cervical region: Secondary | ICD-10-CM | POA: Diagnosis not present

## 2021-08-06 DIAGNOSIS — M9901 Segmental and somatic dysfunction of cervical region: Secondary | ICD-10-CM | POA: Diagnosis not present

## 2021-08-06 DIAGNOSIS — M5442 Lumbago with sciatica, left side: Secondary | ICD-10-CM | POA: Diagnosis not present

## 2021-08-06 DIAGNOSIS — S233XXA Sprain of ligaments of thoracic spine, initial encounter: Secondary | ICD-10-CM | POA: Diagnosis not present

## 2021-08-31 DIAGNOSIS — H6992 Unspecified Eustachian tube disorder, left ear: Secondary | ICD-10-CM | POA: Diagnosis not present

## 2021-08-31 DIAGNOSIS — J329 Chronic sinusitis, unspecified: Secondary | ICD-10-CM | POA: Diagnosis not present

## 2021-09-10 ENCOUNTER — Encounter: Payer: Self-pay | Admitting: Podiatry

## 2021-09-10 ENCOUNTER — Ambulatory Visit: Payer: BC Managed Care – PPO | Admitting: Podiatry

## 2021-09-10 DIAGNOSIS — M7752 Other enthesopathy of left foot: Secondary | ICD-10-CM | POA: Diagnosis not present

## 2021-09-10 NOTE — Progress Notes (Signed)
She presents today for follow-up of her capsulitis of her left foot.  States that it continues to bother me all the time it swells and it hurts is there anything else that we can do.  She states that is starting to affect my ability to perform her daily activities I have a new job coming up and I want to make sure that I am good and able to do this job.  She is will be working at the [employer].  Objective: Vital signs stable she alert oriented x3 she still has swelling around the ankle and tenderness in the posterior compartment of the left ankle.  There appears to be soft tissue structure and nodule in the posterior aspect of this left ankle this is exquisitely tender.  All of the signs and symptoms demonstrate a os trigonum syndrome however there is no os trigonum on radiographic evaluation.  Assessment: Chronic pain posterior compartment left ankle.  Plan: Discussed etiology pathology conservative versus surgical therapies since we did an MRI last time without contrast I think we need to do one with contrast this time particularly since she continues to have pain in the posterior compartment without palpable mass.

## 2021-09-24 ENCOUNTER — Ambulatory Visit
Admission: RE | Admit: 2021-09-24 | Discharge: 2021-09-24 | Disposition: A | Discharge status: Home / Self Care | Payer: BC Managed Care – PPO | Source: Ambulatory Visit | Attending: Podiatry | Admitting: Podiatry

## 2021-09-24 DIAGNOSIS — M7752 Other enthesopathy of left foot: Secondary | ICD-10-CM

## 2021-09-24 DIAGNOSIS — M25572 Pain in left ankle and joints of left foot: Secondary | ICD-10-CM | POA: Diagnosis not present

## 2021-09-24 MED ORDER — GADOBENATE DIMEGLUMINE 529 MG/ML IV SOLN
13.0000 mL | Freq: Once | INTRAVENOUS | Status: AC | PRN
  Administered 2021-09-24: 13 mL via INTRAVENOUS

## 2021-11-03 ENCOUNTER — Ambulatory Visit: Payer: Self-pay | Admitting: Podiatry

## 2021-12-03 ENCOUNTER — Ambulatory Visit: Payer: BC Managed Care – PPO | Admitting: Podiatry

## 2021-12-03 DIAGNOSIS — M7752 Other enthesopathy of left foot: Secondary | ICD-10-CM

## 2021-12-03 MED ORDER — TRIAMCINOLONE ACETONIDE 40 MG/ML IJ SUSP
20.0000 mg | Freq: Once | INTRAMUSCULAR | Status: AC
  Administered 2021-12-03: 20 mg

## 2021-12-06 NOTE — Progress Notes (Signed)
She presents today to the office for follow-up of her MRI which is basically negative for the posterior aspect of her left rear foot.  Objective: Vital signs are stable alert oriented x3 she has pain on deep palpation of the subtalar joint posterior ankle joint area.  MRI was only positive for capsulitis in that area.  Assessment posterior capsulitis.  Plan: Injected the area today 20 mg Kenalog 5 mg Marcaine point of maximal tenderness.  Tolerated procedure well discussed appropriate shoe gear and I will follow-up with her on a as needed basis.

## 2021-12-09 DIAGNOSIS — E559 Vitamin D deficiency, unspecified: Secondary | ICD-10-CM | POA: Diagnosis not present

## 2021-12-09 DIAGNOSIS — E785 Hyperlipidemia, unspecified: Secondary | ICD-10-CM | POA: Diagnosis not present

## 2021-12-16 DIAGNOSIS — Z23 Encounter for immunization: Secondary | ICD-10-CM | POA: Diagnosis not present

## 2021-12-16 DIAGNOSIS — E785 Hyperlipidemia, unspecified: Secondary | ICD-10-CM | POA: Diagnosis not present

## 2021-12-16 DIAGNOSIS — E559 Vitamin D deficiency, unspecified: Secondary | ICD-10-CM | POA: Diagnosis not present

## 2021-12-16 DIAGNOSIS — J309 Allergic rhinitis, unspecified: Secondary | ICD-10-CM | POA: Diagnosis not present

## 2022-02-09 NOTE — Progress Notes (Signed)
[facility] [address], [place] [address]   CLINIC:  Medical Oncology/Hematology  CONSULT NOTE  Patient Care Team: Babs Sciara, MD as PCP - General (Family Medicine) Jena Gauss, Gerrit Friends, MD as Consulting Physician (Gastroenterology)  CHIEF COMPLAINTS/PURPOSE OF CONSULTATION:  Leukopenia   HISTORY OF PRESENTING ILLNESS:   Meghan Acosta 47 y.o. 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  Per patient, her white blood cells have "always been on the low side."  Patient has history of low white blood cells with WBC per Epic records ranging from 3.7-5.2 dating back to at least 2014.  Neutrophils ranging from 1.2-1.9 since at least 2014.  Labs from PCP (12/09/2021) show WBC 3.2 with ANC 1.3, otherwise normal CBC.  Prior to that, she had CBC from 04/08/2021 which showed WBC 5.5 and normal differential.  Prior testing shows HIV screen negative in May 2021.  She denies any high risk behaviors such as multiple sexual partners or IV drug use.  She does not take any medications at home apart from a multivitamin, vitamin C, and vitamin D.  She denies any history of liver disease, cancer, autoimmune/connective tissue disorder, or chemical exposure.  She denies any family history of bone marrow or blood disorders.  Maternal grandfather had prostate cancer.  She denies any frequent or recurrent infections.  No episodic mouth sores.  No unexplained fevers, chills, or weight loss.  She has occasional night sweats.  She denies any new lumps or bumps.   She reports 75% energy and 100% appetite. Sheis maintaining a stable weight at this time.  Past medical history includes Vitamin D deficiency and hyperlipidemia.  She lives at home with her father and her 42-year-old daughter.  She previously worked as a Financial controller, currently working in Landscape architect.  No tobacco, alcohol, or illicit drug use.  She denies  any family history of bone marrow or blood disorders.  Maternal grandfather had prostate cancer.  MEDICAL HISTORY:  Past Medical History:  Diagnosis Date   Allergy     SURGICAL HISTORY: Past Surgical History:  Procedure Laterality Date   COLONOSCOPY N/A 12/05/2013   Procedure: COLONOSCOPY;  Surgeon: Corbin Ade, MD;  Location: [facility];  Service: Endoscopy;  Laterality: N/A;  1045   FOOT SURGERY     WISDOM TOOTH EXTRACTION      SOCIAL HISTORY: Social History   Socioeconomic History   Marital status: Single    Spouse name: Not on file   Number of children: 0   Years of education: Not on file   Highest education level: Not on file  Occupational History   Occupation: office    Employer: [employer]  Tobacco Use   Smoking status: Never   Smokeless tobacco: Never  Substance and Sexual Activity   Alcohol use: No   Drug use: No   Sexual activity: Yes  Other Topics Concern   Not on file  Social History Narrative   Not on file   Social Determinants of Health   Financial Resource Strain: Not on file  Food Insecurity: Not on file  Transportation Needs: Not on file  Physical Activity: Not on file  Stress: Not on file  Social Connections: Not on file  Intimate Partner Violence: Not on file    FAMILY HISTORY: Family History  Problem Relation Age of Onset   Cervical cancer Paternal Aunt    Colon cancer  Neg Hx    Inflammatory bowel disease Neg Hx     ALLERGIES:  has No Known Allergies.  MEDICATIONS:  Current Outpatient Medications  Medication Sig Dispense Refill   levocetirizine (XYZAL) 5 MG tablet Take 5 mg by mouth at bedtime.     metFORMIN (GLUCOPHAGE-XR) 500 MG 24 hr tablet Take by mouth.     No current facility-administered medications for this visit.    REVIEW OF SYSTEMS:    Review of Systems  Constitutional:  Positive for fatigue. Negative for appetite change, chills, diaphoresis, fever and unexpected weight change.  HENT:    Negative for lump/mass and nosebleeds.   Eyes:  Negative for eye problems.  Respiratory:  Negative for cough, hemoptysis and shortness of breath.   Cardiovascular:  Negative for chest pain, leg swelling and palpitations.  Gastrointestinal:  Negative for abdominal pain, blood in stool, constipation, diarrhea, nausea and vomiting.  Genitourinary:  Negative for hematuria.   Skin: Negative.   Neurological:  Positive for headaches. Negative for dizziness and light-headedness.  Hematological:  Does not bruise/bleed easily.  Psychiatric/Behavioral:  Positive for sleep disturbance.       PHYSICAL EXAMINATION:   ECOG PERFORMANCE STATUS: 0 - Asymptomatic  There were no vitals filed for this visit. There were no vitals filed for this visit.  Physical Exam Constitutional:      Appearance: Normal appearance.  HENT:     Head: Normocephalic and atraumatic.     Mouth/Throat:     Mouth: Mucous membranes are moist.  Eyes:     Extraocular Movements: Extraocular movements intact.     Pupils: Pupils are equal, round, and reactive to light.  Cardiovascular:     Rate and Rhythm: Normal rate and regular rhythm.     Pulses: Normal pulses.     Heart sounds: Normal heart sounds.  Pulmonary:     Effort: Pulmonary effort is normal.     Breath sounds: Normal breath sounds.  Abdominal:     General: Bowel sounds are normal.     Palpations: Abdomen is soft.     Tenderness: There is no abdominal tenderness.  Musculoskeletal:        General: No swelling.     Right lower leg: No edema.     Left lower leg: No edema.  Lymphadenopathy:     Cervical: No cervical adenopathy.  Skin:    General: Skin is warm and dry.  Neurological:     General: No focal deficit present.     Mental Status: She is alert and oriented to person, place, and time.  Psychiatric:        Mood and Affect: Mood normal.        Behavior: Behavior normal.       LABORATORY DATA:  I have reviewed the data as listed No results found  for this or any previous visit (from the past 2160 hour(s)).  RADIOGRAPHIC STUDIES: I have personally reviewed the radiological images as listed and agreed with the findings in the report. No results found.   ASSESSMENT & PLAN:  1.  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 Neutrophils ranging from 1.2-1.9 since at least 2014. - Labs from PCP (12/09/2021) show WBC 3.2 with ANC 1.3, otherwise normal CBC. Prior to that, she had CBC from 04/08/2021 which showed WBC 5.5 and normal differential.  -No B symptoms,  recurrent infections, or episodic mouth sores. - No known history of HIV or hepatitis.  Denies any high risk behaviors. - No personal or family history of hematologic malignancy, liver disease, autoimmune/inflammatory disease, or bone marrow/blood disorder - She denies any recent infections or B symptoms such as fever, chills, night sweats, unintentional weight loss - Differential diagnosis favors benign ethnic neutropenia, but we will investigate other potential causes including nutritional deficiencies, infection, hematologic malignancy, or other bone marrow disorder - PLAN: Labs today with CBC, LDH, hepatitis, B12/methylmalonic acid, folate/homocystine, copper; we will check CRP, ESR, ANA, rheumatoid factor; SPEP - Office visit in 3 weeks to discuss results - will consider possible discharge to PCP if above workup is unremarkable   2.  Other history - PMH: Vitamin D deficiency and hyperlipidemia.  - SOCIAL: Lives at home with her father and her 46-year-old daughter. 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 No tobacco, alcohol, or illicit drug use.  - FAMILY: Denies any family history of bone marrow or blood disorders. Maternal grandfather had prostate cancer.    PLAN SUMMARY: >> Labs today >> Office  visit in 2-3 weeks    All questions were answered. The patient knows to call the clinic with any problems, questions or concerns.  Medical decision making: Low  Time spent on visit: I spent 25 minutes counseling the patient face to face. The total time spent in the appointment was 45 minutes and more than 50% was on counseling.  I, Tarri Abernethy PA-C, have seen this patient in conjunction with Dr. Derek Jack.  Greater than 50% of visit was performed by Dr. Delton Coombes.   Harriett Rush, PA-C 02/10/22 10:11 AM  DR. Denina Rieger: I have independently evaluated this patient and formulated my assessment and plan.  I agree with HPI, assessment and plan written by Casey Burkitt, PA-C.  Patient evaluated for leukopenia and neutropenia.  No recurrent infections or B symptoms.  No history of connective tissue disorders or lupus anticoagulant.  Will check for, nutritional deficiencies, connective tissue disorders and infectious process.  We will discuss further plan and results in 2 to 3 weeks.

## 2022-02-10 ENCOUNTER — Inpatient Hospital Stay: Payer: BC Managed Care – PPO | Attending: Hematology | Admitting: Hematology

## 2022-02-10 ENCOUNTER — Inpatient Hospital Stay: Payer: BC Managed Care – PPO

## 2022-02-10 ENCOUNTER — Encounter: Payer: Self-pay | Admitting: Hematology

## 2022-02-10 VITALS — BP 115/78 | HR 71 | Temp 97.7°F | Resp 18 | Ht 68.11 in | Wt 141.1 lb

## 2022-02-10 DIAGNOSIS — Z789 Other specified health status: Secondary | ICD-10-CM

## 2022-02-10 DIAGNOSIS — D72819 Decreased white blood cell count, unspecified: Secondary | ICD-10-CM

## 2022-02-10 DIAGNOSIS — E559 Vitamin D deficiency, unspecified: Secondary | ICD-10-CM | POA: Diagnosis not present

## 2022-02-10 DIAGNOSIS — D709 Neutropenia, unspecified: Secondary | ICD-10-CM | POA: Insufficient documentation

## 2022-02-10 DIAGNOSIS — E785 Hyperlipidemia, unspecified: Secondary | ICD-10-CM | POA: Insufficient documentation

## 2022-02-10 LAB — CBC WITH DIFFERENTIAL/PLATELET
Abs Immature Granulocytes: 0 10*3/uL (ref 0.00–0.07)
Basophils Absolute: 0 10*3/uL (ref 0.0–0.1)
Basophils Relative: 1 %
Eosinophils Absolute: 0 10*3/uL (ref 0.0–0.5)
Eosinophils Relative: 1 %
HCT: 36.3 % (ref 36.0–46.0)
Hemoglobin: 11.9 g/dL — ABNORMAL LOW (ref 12.0–15.0)
Immature Granulocytes: 0 %
Lymphocytes Relative: 63 %
Lymphs Abs: 2.1 10*3/uL (ref 0.7–4.0)
MCH: 31.2 pg (ref 26.0–34.0)
MCHC: 32.8 g/dL (ref 30.0–36.0)
MCV: 95.3 fL (ref 80.0–100.0)
Monocytes Absolute: 0.2 10*3/uL (ref 0.1–1.0)
Monocytes Relative: 7 %
Neutro Abs: 0.9 10*3/uL — ABNORMAL LOW (ref 1.7–7.7)
Neutrophils Relative %: 28 %
Platelets: 193 10*3/uL (ref 150–400)
RBC: 3.81 MIL/uL — ABNORMAL LOW (ref 3.87–5.11)
RDW: 12.9 % (ref 11.5–15.5)
WBC: 3.3 10*3/uL — ABNORMAL LOW (ref 4.0–10.5)
nRBC: 0 % (ref 0.0–0.2)

## 2022-02-10 LAB — HEPATITIS B SURFACE ANTIBODY,QUALITATIVE: Hep B S Ab: REACTIVE — AB

## 2022-02-10 LAB — HEPATITIS B SURFACE ANTIGEN: Hepatitis B Surface Ag: NONREACTIVE

## 2022-02-10 LAB — VITAMIN B12: Vitamin B-12: 835 pg/mL (ref 180–914)

## 2022-02-10 LAB — FOLATE: Folate: 17.5 ng/mL (ref >5.9)

## 2022-02-10 LAB — HEPATITIS B CORE ANTIBODY, TOTAL: Hep B Core Total Ab: NONREACTIVE

## 2022-02-10 LAB — HIV ANTIBODY (ROUTINE TESTING W REFLEX): HIV Screen 4th Generation wRfx: NONREACTIVE

## 2022-02-10 LAB — LACTATE DEHYDROGENASE: LDH: 150 U/L (ref 98–192)

## 2022-02-10 NOTE — Patient Instructions (Signed)
[place] [facility] **   You were seen today by Dr. Delton Coombes & Tarri Abernethy PA-C for your low white blood cells.   You appear to have had mildly low white blood cells for many years. This is most likely what is "normal for you." We will check additional labs today to make sure you do not have any other causes of low white blood cells.  LABS: Check labs TODAY before leaving the hospital building  FOLLOW-UP APPOINTMENT: Office visit in 2 to 3 weeks  ** Thank you for trusting me with your healthcare!  I strive to provide all of my patients with quality care at each visit.  If you receive a survey for this visit, I would be so grateful to you for taking the time to provide feedback.  Thank you in advance!  ~ Meghan Acosta                   Dr. Derek Jack   &   Tarri Abernethy, PA-C   - - - - - - - - - - - - - - - - - -    Thank you for choosing [place] at [facility] to provide your oncology and hematology care.  To afford each patient quality time with our provider, please arrive at least 15 minutes before your scheduled appointment time.   If you have a lab appointment with the [facility] please come in thru the Main Entrance and check in at the main information desk.  You need to re-schedule your appointment should you arrive 10 or more minutes late.  We strive to give you quality time with our providers, and arriving late affects you and other patients whose appointments are after yours.  Also, if you no show three or more times for appointments you may be dismissed from the clinic at the providers discretion.     Again, thank you for choosing [facility].  Our hope is that these requests will decrease the amount of time that you wait before being seen by our physicians.       _____________________________________________________________  Should you have questions  after your visit to [facility], please contact our office at [phone] and follow the prompts.  Our office hours are 8:00 a.m. and 4:30 p.m. Monday - Friday.  Please note that voicemails left after 4:00 p.m. may not be returned until the following business day.  We are closed weekends and major holidays.  You do have access to a nurse 24-7, just call the main number to the clinic [phone] and do not press any options, hold on the line and a nurse will answer the phone.    For prescription refill requests, have your pharmacy contact our office and allow 72 hours.

## 2022-02-11 ENCOUNTER — Inpatient Hospital Stay: Payer: BC Managed Care – PPO | Admitting: Licensed Clinical Social Worker

## 2022-02-11 DIAGNOSIS — D72819 Decreased white blood cell count, unspecified: Secondary | ICD-10-CM

## 2022-02-11 LAB — ANA: Anti Nuclear Antibody (ANA): NEGATIVE

## 2022-02-11 LAB — HCV INTERPRETATION

## 2022-02-11 LAB — RHEUMATOID FACTOR: Rheumatoid fact SerPl-aCnc: 11.6 IU/mL (ref <14.0)

## 2022-02-11 LAB — HCV AB W REFLEX TO QUANT PCR: HCV Ab: NONREACTIVE

## 2022-02-11 NOTE — Progress Notes (Signed)
[facility]  Initial Assessment   Meghan Acosta is a 47 y.o. year old female contacted by phone. Clinical Social Work was referred by medical provider for assessment of psychosocial needs.   SDOH (Social Determinants of Health) assessments performed: Yes SDOH Interventions    Flowsheet Row Office Visit from 02/10/2022 in [place] at [facility] [place] Interventions   Food Insecurity Interventions AMB Referral       [place]: Food Insecurity Present (02/10/2022)  Housing: Low Risk  (02/10/2022)  Transportation Needs: No Transportation Needs (02/10/2022)  Utilities: Not At Risk (02/10/2022)  Depression (PHQ2-9): Low Risk  (02/10/2022)  Tobacco Use: Low Risk  (02/10/2022)     Distress Screen completed: No     No data to display            Family/Social Information:  Housing Arrangement: patient lives with her father as well as her 28 year old daughter.  Family members/support persons in your life? Pt reports she has a number of family members who reside locally and are able to assist if needed. 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  After a difficult year pt states she feels confident that things are on the right track.  Income source: Employment Financial concerns: No Type of concern: None Food access concerns: no Religious or spiritual practice: Hydrographic surveyor Currently in place:  none  Coping/ Adjustment to diagnosis: Patient understands treatment plan and what happens next? Pt is currently undergoing diagnostics and does not yet know if or what treatment may be. Concerns about diagnosis and/or treatment:  Pt is concerned primarily with the unknown as there does not seem to be an obvious answer to her fatigue Patient reported stressors:  Balancing being a  profession and a mother Hopes and/or priorities: Pt's priority is to finish diagnostics with the hope of an answer to her fatigue Patient enjoys time with family/ friends Current coping skills/ strengths: Capable of independent living , Armed forces logistics/support/administrative officer , General fund of knowledge , Motivation for treatment/growth , and Supportive family/friends     SUMMARY: Current SDOH Barriers:  No barriers identified at this time.  Clinical Social Work Clinical Goal(s):  No clinical social work goals at this time  Interventions: Discussed common feeling and emotions when being diagnosed with cancer, and the importance of support during treatment Informed patient of the support team roles and support services at [facility] Provided [place] contact information and encouraged patient to call with any questions or concerns   Follow Up Plan: Patient will contact CSW with any support or resource needs Patient verbalizes understanding of plan: Yes    Henriette Combs, LCSW

## 2022-02-12 LAB — PROTEIN ELECTROPHORESIS, SERUM
A/G Ratio: 1.4 (ref 0.7–1.7)
Albumin ELP: 4.2 g/dL (ref 2.9–4.4)
Alpha-1-Globulin: 0.2 g/dL (ref 0.0–0.4)
Alpha-2-Globulin: 0.8 g/dL (ref 0.4–1.0)
Beta Globulin: 1 g/dL (ref 0.7–1.3)
Gamma Globulin: 1 g/dL (ref 0.4–1.8)
Globulin, Total: 3 g/dL (ref 2.2–3.9)
Total Protein ELP: 7.2 g/dL (ref 6.0–8.5)

## 2022-02-13 LAB — COPPER, SERUM: Copper: 108 ug/dL (ref 80–158)

## 2022-02-14 LAB — METHYLMALONIC ACID, SERUM: Methylmalonic Acid, Quantitative: 109 nmol/L (ref 0–378)

## 2022-02-23 DIAGNOSIS — R059 Cough, unspecified: Secondary | ICD-10-CM | POA: Diagnosis not present

## 2022-02-23 DIAGNOSIS — M542 Cervicalgia: Secondary | ICD-10-CM | POA: Diagnosis not present

## 2022-02-23 DIAGNOSIS — J029 Acute pharyngitis, unspecified: Secondary | ICD-10-CM | POA: Diagnosis not present

## 2022-02-23 DIAGNOSIS — J019 Acute sinusitis, unspecified: Secondary | ICD-10-CM | POA: Diagnosis not present

## 2022-02-23 DIAGNOSIS — R093 Abnormal sputum: Secondary | ICD-10-CM | POA: Diagnosis not present

## 2022-03-02 NOTE — Progress Notes (Unsigned)
Meghan Acosta, [place] [address]   CLINIC:  Medical Oncology/Hematology  PCP:  Perfecto Kingdom, NP [address] [place] [address] [phone]   REASON FOR VISIT:  Follow-up for chronic leukopenia  PRIOR THERAPY: None  CURRENT THERAPY: Under workup  INTERVAL HISTORY:   Meghan Acosta 47 y.o. female returns for routine follow-up of her chronic leukopenia.  She was seen for initial consultation by Dr. Delton Coombes and Tarri Abernethy PA-C on 03/13/2022.  At today's visit, she reports feeling well.  She denies any changes in her baseline health status or symptoms since her initial visit 3 weeks ago.  She continues to deny any recent infections.  No unexplained fevers, chills, or weight loss.  She has some mild fatigue and occasional night sweats.  She denies any new lumps or bumps.  She has 60% energy and 80% appetite. She endorses that she is maintaining a stable weight.   ASSESSMENT & PLAN:  1.   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 Neutrophils ranging from 1.2-1.9 since at least 2014. - Labs from PCP (12/09/2021) show WBC 3.2 with ANC 1.3, otherwise normal CBC. Prior to that, she had CBC from 04/08/2021 which showed WBC 5.5 and normal differential.  -No B symptoms, recurrent infections, or episodic mouth sores. - No known history of HIV or hepatitis.  Denies any high risk behaviors. - No personal or family history of hematologic malignancy, liver disease, autoimmune/inflammatory disease, or bone marrow/blood disorder - She denies any recent infections or B symptoms such as fever, chills, night sweats, unintentional weight loss - Hematology workup (02/10/2022): SPEP negative.  Normal LDH. Negative HIV and hepatitis C.  Hepatitis B consistent with immunity. Negative ANA and rheumatoid  factor. Normal copper, folate, MMA, B12. WBC 3.3 with ANC 0.9. 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  If no changes from baseline at that time, would consider discharge to PCP. - We will also check iron panel due to mild anemia and fatigue.  2.  Other history - PMH: Vitamin D deficiency and hyperlipidemia.  - SOCIAL: Lives at home with her father and her 18-year-old daughter. 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 No tobacco, alcohol, or illicit drug use.  - FAMILY: Denies any family history of bone marrow or blood disorders. Maternal grandfather had prostate cancer.      PLAN SUMMARY: >> Labs TODAY (ferritin, iron/TIBC) - will send MyChart message to discuss results >> Labs only (CBC/D) in 3 months >> Same-day labs (CBC/D) + OFFICE visit in 6 months     REVIEW OF SYSTEMS:   Review of Systems  Constitutional:  Positive for fatigue. Negative for appetite change, chills, diaphoresis, fever and unexpected weight change.  HENT:   Negative for lump/mass and nosebleeds.   Eyes:  Negative for eye problems.  Respiratory:  Negative for cough, hemoptysis and shortness of breath.   Cardiovascular:  Negative for chest pain, leg swelling and palpitations.  Gastrointestinal:  Negative for abdominal pain, blood in stool, constipation, diarrhea, nausea and vomiting.  Genitourinary:  Negative for hematuria.   Skin: Negative.   Neurological:  Negative for dizziness, headaches and light-headedness.  Hematological:  Does not  bruise/bleed easily.  Psychiatric/Behavioral:  Positive for sleep disturbance.      PHYSICAL EXAM:  ECOG PERFORMANCE STATUS: 0 - Asymptomatic  There were no vitals filed for this visit. There were no vitals filed for this visit. Physical Exam Constitutional:       Appearance: Normal appearance.  HENT:     Head: Normocephalic and atraumatic.     Mouth/Throat:     Mouth: Mucous membranes are moist.  Eyes:     Extraocular Movements: Extraocular movements intact.     Pupils: Pupils are equal, round, and reactive to light.  Cardiovascular:     Rate and Rhythm: Normal rate and regular rhythm.     Pulses: Normal pulses.     Heart sounds: Normal heart sounds.  Pulmonary:     Effort: Pulmonary effort is normal.     Breath sounds: Normal breath sounds.  Abdominal:     General: Bowel sounds are normal.     Palpations: Abdomen is soft.     Tenderness: There is no abdominal tenderness.  Musculoskeletal:        General: No swelling.     Right lower leg: No edema.     Left lower leg: No edema.  Lymphadenopathy:     Cervical: No cervical adenopathy.  Skin:    General: Skin is warm and dry.  Neurological:     General: No focal deficit present.     Mental Status: She is alert and oriented to person, place, and time.  Psychiatric:        Mood and Affect: Mood normal.        Behavior: Behavior normal.     PAST MEDICAL/SURGICAL HISTORY:  Past Medical History:  Diagnosis Date   Allergy    Past Surgical History:  Procedure Laterality Date   COLONOSCOPY N/A 12/05/2013   Procedure: COLONOSCOPY;  Surgeon: Daneil Dolin, MD;  Location[facility];  Service: Endoscopy;  Laterality: N/A;  1045   FOOT SURGERY     WISDOM TOOTH EXTRACTION      SOCIAL HISTORY:  Social History   Socioeconomic History   Marital status: Single    Spouse name: Not on file   Number of children: 0   Years of education: Not on file   Highest education level: Not on file  Occupational History   Occupation: office    Employer: [employer]  Tobacco Use   Smoking status: Never   Smokeless tobacco: Never  Substance and Sexual Activity   Alcohol use: No   Drug use: No   Sexual activity: Yes  Other Topics Concern   Not on file  Social History Narrative    Not on file   Social Determinants of Health   Financial Resource Strain: Not on file  Food Insecurity: Food Insecurity Present (02/10/2022)   Hunger Vital Sign    Worried About Running Out of Food in the Last Year: Sometimes true    Ran Out of Food in the Last Year: Sometimes true  Transportation Needs: No Transportation Needs (02/10/2022)   PRAPARE - Hydrologist (Medical): No    Lack of Transportation (Non-Medical): No  Physical Activity: Not on file  Stress: Not on file  Social Connections: Not on file  Intimate Partner Violence: Not At Risk (02/10/2022)   Humiliation, Afraid, Rape, and Kick questionnaire    Fear of Current or Ex-Partner: No    Emotionally Abused: No    Physically Abused: No  Sexually Abused: No    FAMILY HISTORY:  Family History  Problem Relation Age of Onset   Cervical cancer Paternal Aunt    Colon cancer Neg Hx    Inflammatory bowel disease Neg Hx     CURRENT MEDICATIONS:  No outpatient encounter medications on file as of 03/03/2022.   No facility-administered encounter medications on file as of 03/03/2022.    ALLERGIES:  No Known Allergies  LABORATORY DATA:  I have reviewed the labs as listed.  CBC    Component Value Date/Time   WBC 3.3 (L) 02/10/2022 0953   RBC 3.81 (L) 02/10/2022 0953   HGB 11.9 (L) 02/10/2022 0953   HGB 12.3 05/29/2019 1004   HCT 36.3 02/10/2022 0953   HCT 37.1 05/29/2019 1004   PLT 193 02/10/2022 0953   PLT 281 05/29/2019 1004   MCV 95.3 02/10/2022 0953   MCV 94 05/29/2019 1004   MCH 31.2 02/10/2022 0953   MCHC 32.8 02/10/2022 0953   RDW 12.9 02/10/2022 0953   RDW 12.2 05/29/2019 1004   LYMPHSABS 2.1 02/10/2022 0953   LYMPHSABS 1.9 05/29/2019 1004   MONOABS 0.2 02/10/2022 0953   EOSABS 0.0 02/10/2022 0953   EOSABS 0.0 05/29/2019 1004   BASOSABS 0.0 02/10/2022 0953   BASOSABS 0.0 05/29/2019 1004      Latest Ref Rng & Units 05/29/2019   10:04 AM 06/28/2014    2:38 PM 11/14/2013     5:37 PM  CMP  Glucose 65 - 99 mg/dL 77  88  88   BUN 6 - 24 mg/dL 11  11  12    Creatinine 0.57 - 1.00 mg/dL 0.93  1.02  1.05   Sodium 134 - 144 mmol/L 141  142  140   Potassium 3.5 - 5.2 mmol/L 3.8  3.9  4.0   Chloride 96 - 106 mmol/L 103  101  104   CO2 20 - 29 mmol/L 22  24  26    Calcium 8.7 - 10.2 mg/dL 9.8  10.0  9.6   Total Protein 6.0 - 8.5 g/dL 7.0  7.0  7.1   Total Bilirubin 0.0 - 1.2 mg/dL 0.6  0.5  0.2   Alkaline Phos 39 - 117 IU/L 40  39  39   AST 0 - 40 IU/L 18  25  23    ALT 0 - 32 IU/L 17  18  16      DIAGNOSTIC IMAGING:  I have independently reviewed the relevant imaging and discussed with the patient.   WRAP UP:  All questions were answered. The patient knows to call the clinic with any problems, questions or concerns.  Medical decision making: Low  Time spent on visit: I spent 15 minutes counseling the patient face to face. The total time spent in the appointment was 22 minutes and more than 50% was on counseling.  Harriett Rush, PA-C  03/03/22 9:13 AM

## 2022-03-03 ENCOUNTER — Inpatient Hospital Stay: Payer: BC Managed Care – PPO

## 2022-03-03 ENCOUNTER — Inpatient Hospital Stay: Payer: BC Managed Care – PPO | Attending: Hematology | Admitting: Physician Assistant

## 2022-03-03 ENCOUNTER — Other Ambulatory Visit: Payer: Self-pay

## 2022-03-03 VITALS — BP 106/72 | HR 70 | Temp 97.3°F | Resp 18 | Ht 67.0 in | Wt 141.0 lb

## 2022-03-03 DIAGNOSIS — R5383 Other fatigue: Secondary | ICD-10-CM | POA: Diagnosis not present

## 2022-03-03 DIAGNOSIS — E559 Vitamin D deficiency, unspecified: Secondary | ICD-10-CM | POA: Diagnosis not present

## 2022-03-03 DIAGNOSIS — E785 Hyperlipidemia, unspecified: Secondary | ICD-10-CM | POA: Insufficient documentation

## 2022-03-03 DIAGNOSIS — D72819 Decreased white blood cell count, unspecified: Secondary | ICD-10-CM | POA: Diagnosis not present

## 2022-03-03 DIAGNOSIS — D709 Neutropenia, unspecified: Secondary | ICD-10-CM | POA: Diagnosis not present

## 2022-03-03 LAB — IRON AND TIBC
Iron: 78 ug/dL (ref 28–170)
Saturation Ratios: 21 % (ref 10.4–31.8)
TIBC: 377 ug/dL (ref 250–450)
UIBC: 299 ug/dL

## 2022-03-03 LAB — FERRITIN: Ferritin: 38 ng/mL (ref 11–307)

## 2022-03-03 NOTE — Patient Instructions (Signed)
[facility] **   You were seen today by Tarri Abernethy PA-C for your low white blood cells.   Your labs did not show any abnormalities that would have caused low white blood cells. Your low white blood cells are most likely "your normal."  As long as they are staying within the same baseline range, there is no cause for concern. We will check your blood count again in 3 months and in 6 months and we will see you for an office visit again on the same day as your 103-month lab check.  Mild anemia & fatigue We will check iron levels today to see if you have any iron deficiency related to chronic menstrual blood loss.  I will send you a MyChart message to discuss these results.  ** Thank you for trusting me with your healthcare!  I strive to provide all of my patients with quality care at each visit.  If you receive a survey for this visit, I would be so grateful to you for taking the time to provide feedback.  Thank you in advance!  ~ Lindora Alviar                   Dr. Derek Jack   &   Tarri Abernethy, PA-C   - - - - - - - - - - - - - - - - - -    Thank you for choosing [facility] to provide your oncology and hematology care.  To afford each patient quality time with our provider, please arrive at least 15 minutes before your scheduled appointment time.   If you have a lab appointment with the [facility] please come in thru the Main Entrance and check in at the main information desk.  You need to re-schedule your appointment should you arrive 10 or more minutes late.  We strive to give you quality time with our providers, and arriving late affects you and other patients whose appointments are after yours.  Also, if you no show three or more times for appointments you may be dismissed from the clinic at the providers discretion.     Again, thank you for choosing [facility].  Our hope is that these requests will decrease the amount of time that you wait before being seen by our physicians.       _____________________________________________________________  Should you have questions after your visit to [facility], please contact our office at [phone] and follow the prompts.  Our office hours are 8:00 a.m. and 4:30 p.m. Monday - Friday.  Please note that voicemails left after 4:00 p.m. may not be returned until the following business day.  We are closed weekends and major holidays.  You do have access to a nurse 24-7, just call the main number to the clinic [phone] and do not press any options, hold on the line and a nurse will answer the phone.    For prescription refill requests, have your pharmacy contact our office and allow 72 hours.

## 2022-03-15 DIAGNOSIS — M546 Pain in thoracic spine: Secondary | ICD-10-CM | POA: Diagnosis not present

## 2022-03-15 DIAGNOSIS — M9902 Segmental and somatic dysfunction of thoracic region: Secondary | ICD-10-CM | POA: Diagnosis not present

## 2022-03-15 DIAGNOSIS — M9901 Segmental and somatic dysfunction of cervical region: Secondary | ICD-10-CM | POA: Diagnosis not present

## 2022-03-15 DIAGNOSIS — M542 Cervicalgia: Secondary | ICD-10-CM | POA: Diagnosis not present

## 2022-03-19 DIAGNOSIS — M546 Pain in thoracic spine: Secondary | ICD-10-CM | POA: Diagnosis not present

## 2022-03-19 DIAGNOSIS — M542 Cervicalgia: Secondary | ICD-10-CM | POA: Diagnosis not present

## 2022-03-19 DIAGNOSIS — M9901 Segmental and somatic dysfunction of cervical region: Secondary | ICD-10-CM | POA: Diagnosis not present

## 2022-03-19 DIAGNOSIS — M9902 Segmental and somatic dysfunction of thoracic region: Secondary | ICD-10-CM | POA: Diagnosis not present

## 2022-03-22 DIAGNOSIS — N926 Irregular menstruation, unspecified: Secondary | ICD-10-CM | POA: Diagnosis not present

## 2022-03-22 DIAGNOSIS — Z780 Asymptomatic menopausal state: Secondary | ICD-10-CM | POA: Diagnosis not present

## 2022-03-29 DIAGNOSIS — M546 Pain in thoracic spine: Secondary | ICD-10-CM | POA: Diagnosis not present

## 2022-03-29 DIAGNOSIS — M9901 Segmental and somatic dysfunction of cervical region: Secondary | ICD-10-CM | POA: Diagnosis not present

## 2022-03-29 DIAGNOSIS — M9902 Segmental and somatic dysfunction of thoracic region: Secondary | ICD-10-CM | POA: Diagnosis not present

## 2022-03-29 DIAGNOSIS — M542 Cervicalgia: Secondary | ICD-10-CM | POA: Diagnosis not present

## 2022-04-05 DIAGNOSIS — M542 Cervicalgia: Secondary | ICD-10-CM | POA: Diagnosis not present

## 2022-04-05 DIAGNOSIS — M546 Pain in thoracic spine: Secondary | ICD-10-CM | POA: Diagnosis not present

## 2022-04-05 DIAGNOSIS — M9901 Segmental and somatic dysfunction of cervical region: Secondary | ICD-10-CM | POA: Diagnosis not present

## 2022-04-05 DIAGNOSIS — M9902 Segmental and somatic dysfunction of thoracic region: Secondary | ICD-10-CM | POA: Diagnosis not present

## 2022-04-06 DIAGNOSIS — N9089 Other specified noninflammatory disorders of vulva and perineum: Secondary | ICD-10-CM | POA: Diagnosis not present

## 2022-04-06 DIAGNOSIS — Z202 Contact with and (suspected) exposure to infections with a predominantly sexual mode of transmission: Secondary | ICD-10-CM | POA: Diagnosis not present

## 2022-05-06 DIAGNOSIS — M542 Cervicalgia: Secondary | ICD-10-CM | POA: Diagnosis not present

## 2022-05-06 DIAGNOSIS — M9902 Segmental and somatic dysfunction of thoracic region: Secondary | ICD-10-CM | POA: Diagnosis not present

## 2022-05-06 DIAGNOSIS — M546 Pain in thoracic spine: Secondary | ICD-10-CM | POA: Diagnosis not present

## 2022-05-06 DIAGNOSIS — M9901 Segmental and somatic dysfunction of cervical region: Secondary | ICD-10-CM | POA: Diagnosis not present

## 2022-05-12 DIAGNOSIS — M546 Pain in thoracic spine: Secondary | ICD-10-CM | POA: Diagnosis not present

## 2022-05-12 DIAGNOSIS — M542 Cervicalgia: Secondary | ICD-10-CM | POA: Diagnosis not present

## 2022-05-12 DIAGNOSIS — M9902 Segmental and somatic dysfunction of thoracic region: Secondary | ICD-10-CM | POA: Diagnosis not present

## 2022-05-12 DIAGNOSIS — M9901 Segmental and somatic dysfunction of cervical region: Secondary | ICD-10-CM | POA: Diagnosis not present

## 2022-05-27 DIAGNOSIS — M9902 Segmental and somatic dysfunction of thoracic region: Secondary | ICD-10-CM | POA: Diagnosis not present

## 2022-05-27 DIAGNOSIS — M546 Pain in thoracic spine: Secondary | ICD-10-CM | POA: Diagnosis not present

## 2022-05-27 DIAGNOSIS — M9901 Segmental and somatic dysfunction of cervical region: Secondary | ICD-10-CM | POA: Diagnosis not present

## 2022-05-27 DIAGNOSIS — M542 Cervicalgia: Secondary | ICD-10-CM | POA: Diagnosis not present

## 2022-06-01 ENCOUNTER — Inpatient Hospital Stay: Payer: BC Managed Care – PPO

## 2022-06-07 DIAGNOSIS — Z01419 Encounter for gynecological examination (general) (routine) without abnormal findings: Secondary | ICD-10-CM | POA: Diagnosis not present

## 2022-06-07 DIAGNOSIS — Z1389 Encounter for screening for other disorder: Secondary | ICD-10-CM | POA: Diagnosis not present

## 2022-06-07 DIAGNOSIS — Z1211 Encounter for screening for malignant neoplasm of colon: Secondary | ICD-10-CM | POA: Diagnosis not present

## 2022-06-07 DIAGNOSIS — Z113 Encounter for screening for infections with a predominantly sexual mode of transmission: Secondary | ICD-10-CM | POA: Diagnosis not present

## 2022-06-07 DIAGNOSIS — N898 Other specified noninflammatory disorders of vagina: Secondary | ICD-10-CM | POA: Diagnosis not present

## 2022-06-07 DIAGNOSIS — N911 Secondary amenorrhea: Secondary | ICD-10-CM | POA: Diagnosis not present

## 2022-06-07 DIAGNOSIS — Z1231 Encounter for screening mammogram for malignant neoplasm of breast: Secondary | ICD-10-CM | POA: Diagnosis not present

## 2022-06-07 DIAGNOSIS — Z130 Encounter for screening for diseases of the blood and blood-forming organs and certain disorders involving the immune mechanism: Secondary | ICD-10-CM | POA: Diagnosis not present

## 2022-06-26 DIAGNOSIS — Z713 Dietary counseling and surveillance: Secondary | ICD-10-CM | POA: Diagnosis not present

## 2022-06-26 DIAGNOSIS — R110 Nausea: Secondary | ICD-10-CM | POA: Diagnosis not present

## 2022-06-26 DIAGNOSIS — R109 Unspecified abdominal pain: Secondary | ICD-10-CM | POA: Diagnosis not present

## 2022-06-26 DIAGNOSIS — Z6822 Body mass index (BMI) 22.0-22.9, adult: Secondary | ICD-10-CM | POA: Diagnosis not present

## 2022-06-26 DIAGNOSIS — R195 Other fecal abnormalities: Secondary | ICD-10-CM | POA: Diagnosis not present

## 2022-06-30 DIAGNOSIS — E785 Hyperlipidemia, unspecified: Secondary | ICD-10-CM | POA: Diagnosis not present

## 2022-06-30 DIAGNOSIS — E559 Vitamin D deficiency, unspecified: Secondary | ICD-10-CM | POA: Diagnosis not present

## 2022-07-13 DIAGNOSIS — J309 Allergic rhinitis, unspecified: Secondary | ICD-10-CM | POA: Diagnosis not present

## 2022-07-13 DIAGNOSIS — E785 Hyperlipidemia, unspecified: Secondary | ICD-10-CM | POA: Diagnosis not present

## 2022-07-13 DIAGNOSIS — E559 Vitamin D deficiency, unspecified: Secondary | ICD-10-CM | POA: Diagnosis not present

## 2022-07-13 DIAGNOSIS — Z1211 Encounter for screening for malignant neoplasm of colon: Secondary | ICD-10-CM | POA: Diagnosis not present

## 2022-07-13 DIAGNOSIS — H6122 Impacted cerumen, left ear: Secondary | ICD-10-CM | POA: Diagnosis not present

## 2022-07-13 DIAGNOSIS — R194 Change in bowel habit: Secondary | ICD-10-CM | POA: Diagnosis not present

## 2022-07-13 DIAGNOSIS — D708 Other neutropenia: Secondary | ICD-10-CM | POA: Diagnosis not present

## 2022-07-13 DIAGNOSIS — R1032 Left lower quadrant pain: Secondary | ICD-10-CM | POA: Diagnosis not present

## 2022-07-13 DIAGNOSIS — R140 Abdominal distension (gaseous): Secondary | ICD-10-CM | POA: Diagnosis not present

## 2022-07-16 ENCOUNTER — Inpatient Hospital Stay: Payer: BC Managed Care – PPO | Attending: Hematology

## 2022-07-16 DIAGNOSIS — D709 Neutropenia, unspecified: Secondary | ICD-10-CM | POA: Diagnosis not present

## 2022-07-16 DIAGNOSIS — D72819 Decreased white blood cell count, unspecified: Secondary | ICD-10-CM

## 2022-07-16 DIAGNOSIS — Z1211 Encounter for screening for malignant neoplasm of colon: Secondary | ICD-10-CM | POA: Diagnosis not present

## 2022-07-16 LAB — CBC WITH DIFFERENTIAL/PLATELET
Abs Immature Granulocytes: 0 10*3/uL (ref 0.00–0.07)
Basophils Absolute: 0 10*3/uL (ref 0.0–0.1)
Basophils Relative: 0 %
Eosinophils Absolute: 0 10*3/uL (ref 0.0–0.5)
Eosinophils Relative: 1 %
HCT: 37.5 % (ref 36.0–46.0)
Hemoglobin: 12.4 g/dL (ref 12.0–15.0)
Lymphocytes Relative: 58 %
Lymphs Abs: 2.1 10*3/uL (ref 0.7–4.0)
MCH: 30.8 pg (ref 26.0–34.0)
MCHC: 33.1 g/dL (ref 30.0–36.0)
MCV: 93.3 fL (ref 80.0–100.0)
Monocytes Absolute: 0.1 10*3/uL (ref 0.1–1.0)
Monocytes Relative: 3 %
Neutro Abs: 1.4 10*3/uL — ABNORMAL LOW (ref 1.7–7.7)
Neutrophils Relative %: 38 %
Platelets: 247 10*3/uL (ref 150–400)
RBC: 4.02 MIL/uL (ref 3.87–5.11)
RDW: 11.9 % (ref 11.5–15.5)
WBC: 3.6 10*3/uL — ABNORMAL LOW (ref 4.0–10.5)
nRBC: 0 % (ref 0.0–0.2)

## 2022-08-13 DIAGNOSIS — N898 Other specified noninflammatory disorders of vagina: Secondary | ICD-10-CM | POA: Diagnosis not present

## 2022-08-13 DIAGNOSIS — E28319 Asymptomatic premature menopause: Secondary | ICD-10-CM | POA: Diagnosis not present

## 2022-09-02 ENCOUNTER — Inpatient Hospital Stay: Payer: BC Managed Care – PPO | Admitting: Physician Assistant

## 2022-09-02 ENCOUNTER — Inpatient Hospital Stay: Payer: BC Managed Care – PPO | Attending: Hematology

## 2022-09-02 DIAGNOSIS — D709 Neutropenia, unspecified: Secondary | ICD-10-CM | POA: Insufficient documentation

## 2022-09-02 DIAGNOSIS — R5383 Other fatigue: Secondary | ICD-10-CM | POA: Diagnosis not present

## 2022-09-02 DIAGNOSIS — D72819 Decreased white blood cell count, unspecified: Secondary | ICD-10-CM

## 2022-09-02 DIAGNOSIS — E785 Hyperlipidemia, unspecified: Secondary | ICD-10-CM | POA: Diagnosis not present

## 2022-09-02 DIAGNOSIS — E559 Vitamin D deficiency, unspecified: Secondary | ICD-10-CM | POA: Insufficient documentation

## 2022-09-02 LAB — CBC WITH DIFFERENTIAL/PLATELET
Abs Immature Granulocytes: 0.01 10*3/uL (ref 0.00–0.07)
Basophils Absolute: 0 10*3/uL (ref 0.0–0.1)
Basophils Relative: 0 %
Eosinophils Absolute: 0 10*3/uL (ref 0.0–0.5)
Eosinophils Relative: 0 %
HCT: 35.2 % — ABNORMAL LOW (ref 36.0–46.0)
Hemoglobin: 11.8 g/dL — ABNORMAL LOW (ref 12.0–15.0)
Immature Granulocytes: 0 %
Lymphocytes Relative: 47 %
Lymphs Abs: 2.2 10*3/uL (ref 0.7–4.0)
MCH: 31.6 pg (ref 26.0–34.0)
MCHC: 33.5 g/dL (ref 30.0–36.0)
MCV: 94.1 fL (ref 80.0–100.0)
Monocytes Absolute: 0.3 10*3/uL (ref 0.1–1.0)
Monocytes Relative: 6 %
Neutro Abs: 2.2 10*3/uL (ref 1.7–7.7)
Neutrophils Relative %: 47 %
Platelets: 248 10*3/uL (ref 150–400)
RBC: 3.74 MIL/uL — ABNORMAL LOW (ref 3.87–5.11)
RDW: 13.1 % (ref 11.5–15.5)
WBC: 4.7 10*3/uL (ref 4.0–10.5)
nRBC: 0 % (ref 0.0–0.2)

## 2022-09-09 ENCOUNTER — Inpatient Hospital Stay: Payer: BC Managed Care – PPO

## 2022-09-09 ENCOUNTER — Inpatient Hospital Stay (HOSPITAL_BASED_OUTPATIENT_CLINIC_OR_DEPARTMENT_OTHER): Payer: BC Managed Care – PPO | Admitting: Oncology

## 2022-09-09 ENCOUNTER — Other Ambulatory Visit (HOSPITAL_COMMUNITY): Payer: Self-pay

## 2022-09-09 VITALS — BP 107/68 | HR 73 | Temp 98.3°F | Resp 17 | Ht 67.0 in | Wt 149.6 lb

## 2022-09-09 DIAGNOSIS — D72819 Decreased white blood cell count, unspecified: Secondary | ICD-10-CM | POA: Diagnosis not present

## 2022-09-09 DIAGNOSIS — R5383 Other fatigue: Secondary | ICD-10-CM

## 2022-09-09 DIAGNOSIS — D709 Neutropenia, unspecified: Secondary | ICD-10-CM | POA: Diagnosis not present

## 2022-09-09 DIAGNOSIS — E559 Vitamin D deficiency, unspecified: Secondary | ICD-10-CM | POA: Diagnosis not present

## 2022-09-09 DIAGNOSIS — E785 Hyperlipidemia, unspecified: Secondary | ICD-10-CM | POA: Diagnosis not present

## 2022-09-09 LAB — VITAMIN D 25 HYDROXY (VIT D DEFICIENCY, FRACTURES): Vit D, 25-Hydroxy: 41.22 ng/mL (ref 30–100)

## 2022-09-09 LAB — IRON AND TIBC
Iron: 85 ug/dL (ref 28–170)
Saturation Ratios: 19 % (ref 10.4–31.8)
TIBC: 452 ug/dL — ABNORMAL HIGH (ref 250–450)
UIBC: 367 ug/dL

## 2022-09-09 LAB — VITAMIN B12: Vitamin B-12: 504 pg/mL (ref 180–914)

## 2022-09-09 LAB — FERRITIN: Ferritin: 43 ng/mL (ref 11–307)

## 2022-09-09 NOTE — Progress Notes (Signed)
[facility] [address], [place] [address]   CLINIC:  Medical Oncology/Hematology  PCP:  Cristino Martes, NP [address] [place] [address] [phone]   REASON FOR VISIT:  Follow-up for chronic leukopenia  PRIOR THERAPY: None  CURRENT THERAPY: Under workup  INTERVAL HISTORY:   [name] 47 y.o. female returns for routine follow-up of her chronic leukopenia.  She was last seen in clinic on 03/03/2022 by Rojelio Brenner, PA.  In the interim, she denies any hospitalizations, surgeries or changes in baseline health.  Reports feeling more tired then usual.  States she feels like she can fall asleep or take a nap anywhere.  Denies any bleeding such as epistaxis, hematemesis, hematochezia, melena or gingival bleeding.  Denies menstrual cycles.  She is currently on birth control due to premenopausal symptoms per her gynecologist.  Reports she has never taken iron supplements.  She is currently taking calcium and vitamin D but otherwise no other over-the-counter supplements. Denies any recurrent or recent infections, unexplained fevers, chills or weight loss.  Has some mild occasional fatigue and night sweats.  Denies any new lumps or bumps.  Energy and appetite are stable.   ASSESSMENT & PLAN:  1.   Neutropenia - Here at the request of primary care provider Cristino Martes NP) for evaluation of leukopenia - Lab review per Epic records ranging from 3.7-5.2 dating back to at least 2014. Neutrophils ranging from 1.2-1.9 since at least 2014. - Labs from PCP (12/09/2021) show WBC 3.2 with ANC 1.3, otherwise normal CBC. Prior to that, she had CBC from 04/08/2021 which showed WBC 5.5 and normal differential.  -No B symptoms, recurrent infections, or episodic mouth sores. - No known history of HIV or hepatitis.  Denies any high risk behaviors. - No personal or family history of hematologic malignancy, liver disease, autoimmune/inflammatory disease, or bone  marrow/blood disorder - She denies any recent infections or B symptoms such as fever, chills, night sweats, unintentional weight loss - Hematology workup (02/10/2022): SPEP negative.  Normal LDH. Negative HIV and hepatitis C.  Hepatitis B consistent with immunity. Negative ANA and rheumatoid factor. Normal copper, folate, MMA, B12. WBC 3.3 with ANC 0.9. -Repeat lab work from 09/02/2022 shows a hemoglobin of 11.8, white blood cell 4.7, platelet count 248 and ANC of 2.2. 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 -Return to clinic in 6 months with repeat lab work and see provider.  2.  Other history - PMH: Vitamin D deficiency and hyperlipidemia.  - SOCIAL: Lives at home with her father and her 40-year-old daughter. She previously worked as a Financial controller, currently working in Landscape architect. No tobacco, alcohol, or illicit drug use.  - FAMILY: Denies any family history of bone marrow or blood disorders. Maternal grandfather had prostate cancer.    3.  Fatigue -Previously had a low normal ferritin and iron saturation.  Repeat lab work from today shows a ferritin of 43, iron saturation 19 and TIBC of 452 which is slightly elevated.  Vitamin B12 is 504 and MMA is pending.  Vitamin D levels are within normal limits. -Discussed starting oral iron supplements such as ferrous sulfate 325 mg tablets daily versus IV iron.  She would like to think about it and get back to me.  -Return to clinic in 6 months for follow-up with labs.  PLAN SUMMARY: >> Labs today- add on.  >> Will call with results.  >> RTC in  6 months with labs.     REVIEW OF SYSTEMS:   Review of Systems  Constitutional:  Positive for fatigue.  Gastrointestinal:  Positive for constipation and nausea.  Endocrine: Positive for hot flashes (improved with BC).  Neurological:  Positive for dizziness.  Hematological:  Bruises/bleeds easily.     PHYSICAL  EXAM:  ECOG PERFORMANCE STATUS: 0 - Asymptomatic  There were no vitals filed for this visit. There were no vitals filed for this visit. Physical Exam Constitutional:      Appearance: Normal appearance.  HENT:     Head: Normocephalic and atraumatic.  Eyes:     Pupils: Pupils are equal, round, and reactive to light.  Cardiovascular:     Rate and Rhythm: Normal rate and regular rhythm.     Heart sounds: Normal heart sounds. No murmur heard. Pulmonary:     Effort: Pulmonary effort is normal.     Breath sounds: Normal breath sounds. No wheezing.  Abdominal:     General: Bowel sounds are normal. There is no distension.     Palpations: Abdomen is soft.     Tenderness: There is no abdominal tenderness.  Musculoskeletal:        General: Normal range of motion.     Cervical back: Normal range of motion.  Skin:    General: Skin is warm and dry.     Findings: No rash.  Neurological:     Mental Status: She is alert and oriented to person, place, and time.  Psychiatric:        Judgment: Judgment normal.     PAST MEDICAL/SURGICAL HISTORY:  Past Medical History:  Diagnosis Date   Allergy    Past Surgical History:  Procedure Laterality Date   COLONOSCOPY N/A 12/05/2013   Procedure: COLONOSCOPY;  Surgeon: Corbin Ade, MD;  [facility];  Service: Endoscopy;  Laterality: N/A;  1045   FOOT SURGERY     WISDOM TOOTH EXTRACTION      SOCIAL HISTORY:  Social History   Socioeconomic History   Marital status: Single    Spouse name: Not on file   Number of children: 0   Years of education: Not on file   Highest education level: Not on file  Occupational History   Occupation: office    Employer: [employer]  Tobacco Use   Smoking status: Never   Smokeless tobacco: Never  Substance and Sexual Activity   Alcohol use: No   Drug use: No   Sexual activity: Yes  Other Topics Concern   Not on file  Social History Narrative   Not on file   Social  Determinants of Health   Financial Resource Strain: Not on file  Food Insecurity: Food Insecurity Present (02/10/2022)   Hunger Vital Sign    Worried About Running Out of Food in the Last Year: Sometimes true    Ran Out of Food in the Last Year: Sometimes true  Transportation Needs: No Transportation Needs (02/10/2022)   PRAPARE - Administrator, Civil Service (Medical): No    Lack of Transportation (Non-Medical): No  Physical Activity: Not on file  Stress: Not on file  Social Connections: Not on file  Intimate Partner Violence: Not At Risk (02/10/2022)   Humiliation, Afraid, Rape, and Kick questionnaire    Fear of Current or Ex-Partner: No    Emotionally Abused: No    Physically Abused: No    Sexually Abused: No    FAMILY HISTORY:  Family History  Problem Relation Age of Onset   Cervical cancer Paternal Aunt    Colon cancer Neg Hx    Inflammatory bowel disease Neg Hx     CURRENT MEDICATIONS:  Outpatient Encounter Medications as of 09/09/2022  Medication Sig   LARIN 24 FE 1-20 MG-MCG(24) tablet Take 1 tablet by mouth daily.   No facility-administered encounter medications on file as of 09/09/2022.    ALLERGIES:  No Known Allergies  LABORATORY DATA:  I have reviewed the labs as listed.  CBC    Component Value Date/Time   WBC 4.7 09/02/2022 1401   RBC 3.74 (L) 09/02/2022 1401   HGB 11.8 (L) 09/02/2022 1401   HGB 12.3 05/29/2019 1004   HCT 35.2 (L) 09/02/2022 1401   HCT 37.1 05/29/2019 1004   PLT 248 09/02/2022 1401   PLT 281 05/29/2019 1004   MCV 94.1 09/02/2022 1401   MCV 94 05/29/2019 1004   MCH 31.6 09/02/2022 1401   MCHC 33.5 09/02/2022 1401   RDW 13.1 09/02/2022 1401   RDW 12.2 05/29/2019 1004   LYMPHSABS 2.2 09/02/2022 1401   LYMPHSABS 1.9 05/29/2019 1004   MONOABS 0.3 09/02/2022 1401   EOSABS 0.0 09/02/2022 1401   EOSABS 0.0 05/29/2019 1004   BASOSABS 0.0 09/02/2022 1401   BASOSABS 0.0 05/29/2019 1004      Latest Ref Rng & Units 05/29/2019    10:04 AM 06/28/2014    2:38 PM 11/14/2013    5:37 PM  CMP  Glucose 65 - 99 mg/dL 77  88  88   BUN 6 - 24 mg/dL 11  11  12    Creatinine 0.57 - 1.00 mg/dL 5.42  7.06  2.37   Sodium 134 - 144 mmol/L 141  142  140   Potassium 3.5 - 5.2 mmol/L 3.8  3.9  4.0   Chloride 96 - 106 mmol/L 103  101  104   CO2 20 - 29 mmol/L 22  24  26    Calcium 8.7 - 10.2 mg/dL 9.8  62.8  9.6   Total Protein 6.0 - 8.5 g/dL 7.0  7.0  7.1   Total Bilirubin 0.0 - 1.2 mg/dL 0.6  0.5  0.2   Alkaline Phos 39 - 117 IU/L 40  39  39   AST 0 - 40 IU/L 18  25  23    ALT 0 - 32 IU/L 17  18  16      DIAGNOSTIC IMAGING:  I have independently reviewed the relevant imaging and discussed with the patient.   WRAP UP:  All questions were answered. The patient knows to call the clinic with any problems, questions or concerns.  Medical decision making: Low  Time spent on visit: I spent 15 minutes counseling the patient face to face. The total time spent in the appointment was 22 minutes and more than 50% was on counseling.  Mauro Kaufmann, NP  09/09/22 2:38 PM

## 2022-09-10 ENCOUNTER — Encounter: Payer: Self-pay | Admitting: Oncology

## 2022-09-13 LAB — METHYLMALONIC ACID, SERUM: Methylmalonic Acid, Quantitative: 126 nmol/L (ref 0–378)

## 2022-11-16 DIAGNOSIS — Z130 Encounter for screening for diseases of the blood and blood-forming organs and certain disorders involving the immune mechanism: Secondary | ICD-10-CM | POA: Diagnosis not present

## 2022-11-16 DIAGNOSIS — E28319 Asymptomatic premature menopause: Secondary | ICD-10-CM | POA: Diagnosis not present

## 2022-11-16 DIAGNOSIS — R102 Pelvic and perineal pain: Secondary | ICD-10-CM | POA: Diagnosis not present

## 2022-11-16 DIAGNOSIS — Z01411 Encounter for gynecological examination (general) (routine) with abnormal findings: Secondary | ICD-10-CM | POA: Diagnosis not present

## 2022-11-16 DIAGNOSIS — R319 Hematuria, unspecified: Secondary | ICD-10-CM | POA: Diagnosis not present

## 2022-11-22 DIAGNOSIS — R102 Pelvic and perineal pain: Secondary | ICD-10-CM | POA: Diagnosis not present

## 2022-11-30 ENCOUNTER — Ambulatory Visit: Payer: BC Managed Care – PPO | Admitting: Podiatry

## 2022-12-16 ENCOUNTER — Ambulatory Visit: Payer: BC Managed Care – PPO | Admitting: Podiatry

## 2022-12-16 ENCOUNTER — Encounter: Payer: Self-pay | Admitting: Podiatry

## 2022-12-16 DIAGNOSIS — M7752 Other enthesopathy of left foot: Secondary | ICD-10-CM

## 2022-12-16 MED ORDER — TRIAMCINOLONE ACETONIDE 40 MG/ML IJ SUSP
20.0000 mg | Freq: Once | INTRAMUSCULAR | Status: AC
Start: 2022-12-16 — End: 2022-12-16
  Administered 2022-12-16: 20 mg

## 2022-12-16 NOTE — Progress Notes (Signed)
She presents today chief complaint of a recurrence of pain to the posterior lateral aspect of her left ankle.  She states has been going on now for the past couple of weeks but feels like it used to feel.  She states that she gets a spasm when she goes up on her toes which the majority of it is in the anterior ankle but she does have some pain in the posterior ankle.  Objective: Vital signs are stable alert oriented x 3.  Pulses are palpable.  She has tenderness on palpation of the anterior ankle without fluctuance she also has tenderness on palpation of the sinus tarsi but the majority of her tenderness is with plantarflexion of the left foot in the posterior lateral compartment and on palpation of that area.  This is similar to findings from the MRI and previous evaluation.  Assessment: Posterior lateral capsulitis left.  Plan: I injected the ankle joints after alcohol skin prep with Kenalog and local anesthetic she tolerated procedure well without complications I will follow-up with her on an as-needed basis once again discussed appropriate shoe gear.

## 2022-12-22 DIAGNOSIS — L293 Anogenital pruritus, unspecified: Secondary | ICD-10-CM | POA: Diagnosis not present

## 2023-01-14 DIAGNOSIS — E559 Vitamin D deficiency, unspecified: Secondary | ICD-10-CM | POA: Diagnosis not present

## 2023-01-14 DIAGNOSIS — E785 Hyperlipidemia, unspecified: Secondary | ICD-10-CM | POA: Diagnosis not present

## 2023-01-20 DIAGNOSIS — Z23 Encounter for immunization: Secondary | ICD-10-CM | POA: Diagnosis not present

## 2023-01-20 DIAGNOSIS — M25549 Pain in joints of unspecified hand: Secondary | ICD-10-CM | POA: Diagnosis not present

## 2023-01-20 DIAGNOSIS — R944 Abnormal results of kidney function studies: Secondary | ICD-10-CM | POA: Diagnosis not present

## 2023-01-20 DIAGNOSIS — Z0000 Encounter for general adult medical examination without abnormal findings: Secondary | ICD-10-CM | POA: Diagnosis not present

## 2023-01-20 DIAGNOSIS — E785 Hyperlipidemia, unspecified: Secondary | ICD-10-CM | POA: Diagnosis not present

## 2023-01-20 DIAGNOSIS — D708 Other neutropenia: Secondary | ICD-10-CM | POA: Diagnosis not present

## 2023-01-20 DIAGNOSIS — Z0001 Encounter for general adult medical examination with abnormal findings: Secondary | ICD-10-CM | POA: Diagnosis not present

## 2023-01-20 DIAGNOSIS — R222 Localized swelling, mass and lump, trunk: Secondary | ICD-10-CM | POA: Diagnosis not present

## 2023-01-24 ENCOUNTER — Ambulatory Visit (HOSPITAL_COMMUNITY)
Admission: RE | Admit: 2023-01-24 | Discharge: 2023-01-24 | Disposition: A | Discharge status: Home / Self Care | Payer: BC Managed Care – PPO | Source: Ambulatory Visit | Attending: Nurse Practitioner | Admitting: Nurse Practitioner

## 2023-01-24 ENCOUNTER — Encounter (HOSPITAL_COMMUNITY): Payer: Self-pay | Admitting: Nurse Practitioner

## 2023-01-24 ENCOUNTER — Other Ambulatory Visit (HOSPITAL_COMMUNITY): Payer: Self-pay | Admitting: Nurse Practitioner

## 2023-01-24 DIAGNOSIS — M25541 Pain in joints of right hand: Secondary | ICD-10-CM

## 2023-01-24 DIAGNOSIS — M79641 Pain in right hand: Secondary | ICD-10-CM | POA: Diagnosis not present

## 2023-01-24 DIAGNOSIS — R222 Localized swelling, mass and lump, trunk: Secondary | ICD-10-CM

## 2023-01-27 ENCOUNTER — Encounter (HOSPITAL_COMMUNITY): Payer: Self-pay

## 2023-01-27 ENCOUNTER — Ambulatory Visit (HOSPITAL_COMMUNITY): Payer: BC Managed Care – PPO | Attending: Nurse Practitioner

## 2023-03-07 DIAGNOSIS — R059 Cough, unspecified: Secondary | ICD-10-CM | POA: Diagnosis not present

## 2023-03-07 DIAGNOSIS — J029 Acute pharyngitis, unspecified: Secondary | ICD-10-CM | POA: Diagnosis not present

## 2023-03-11 ENCOUNTER — Other Ambulatory Visit: Payer: Self-pay

## 2023-03-11 DIAGNOSIS — R5383 Other fatigue: Secondary | ICD-10-CM

## 2023-03-11 DIAGNOSIS — D72819 Decreased white blood cell count, unspecified: Secondary | ICD-10-CM

## 2023-03-14 ENCOUNTER — Inpatient Hospital Stay: Payer: BC Managed Care – PPO

## 2023-03-14 ENCOUNTER — Inpatient Hospital Stay: Payer: BC Managed Care – PPO | Attending: Hematology

## 2023-03-14 DIAGNOSIS — R5383 Other fatigue: Secondary | ICD-10-CM

## 2023-03-14 DIAGNOSIS — D509 Iron deficiency anemia, unspecified: Secondary | ICD-10-CM | POA: Insufficient documentation

## 2023-03-14 DIAGNOSIS — D72819 Decreased white blood cell count, unspecified: Secondary | ICD-10-CM | POA: Insufficient documentation

## 2023-03-14 LAB — CBC WITH DIFFERENTIAL/PLATELET
Abs Immature Granulocytes: 0.01 10*3/uL (ref 0.00–0.07)
Basophils Absolute: 0 10*3/uL (ref 0.0–0.1)
Basophils Relative: 0 %
Eosinophils Absolute: 0.1 10*3/uL (ref 0.0–0.5)
Eosinophils Relative: 1 %
HCT: 36.7 % (ref 36.0–46.0)
Hemoglobin: 12.3 g/dL (ref 12.0–15.0)
Immature Granulocytes: 0 %
Lymphocytes Relative: 42 %
Lymphs Abs: 2.5 10*3/uL (ref 0.7–4.0)
MCH: 31.8 pg (ref 26.0–34.0)
MCHC: 33.5 g/dL (ref 30.0–36.0)
MCV: 94.8 fL (ref 80.0–100.0)
Monocytes Absolute: 0.4 10*3/uL (ref 0.1–1.0)
Monocytes Relative: 7 %
Neutro Abs: 2.9 10*3/uL (ref 1.7–7.7)
Neutrophils Relative %: 50 %
Platelets: 277 10*3/uL (ref 150–400)
RBC: 3.87 MIL/uL (ref 3.87–5.11)
RDW: 12.2 % (ref 11.5–15.5)
WBC: 5.9 10*3/uL (ref 4.0–10.5)
nRBC: 0 % (ref 0.0–0.2)

## 2023-03-14 LAB — IRON AND TIBC
Iron: 97 ug/dL (ref 28–170)
Saturation Ratios: 20 % (ref 10.4–31.8)
TIBC: 484 ug/dL — ABNORMAL HIGH (ref 250–450)
UIBC: 387 ug/dL

## 2023-03-14 LAB — FERRITIN: Ferritin: 48 ng/mL (ref 11–307)

## 2023-03-17 ENCOUNTER — Inpatient Hospital Stay (HOSPITAL_BASED_OUTPATIENT_CLINIC_OR_DEPARTMENT_OTHER): Payer: BC Managed Care – PPO | Admitting: Oncology

## 2023-03-17 ENCOUNTER — Encounter: Payer: Self-pay | Admitting: Oncology

## 2023-03-17 DIAGNOSIS — D509 Iron deficiency anemia, unspecified: Secondary | ICD-10-CM | POA: Diagnosis not present

## 2023-03-17 DIAGNOSIS — R5383 Other fatigue: Secondary | ICD-10-CM

## 2023-03-17 DIAGNOSIS — D72819 Decreased white blood cell count, unspecified: Secondary | ICD-10-CM | POA: Diagnosis not present

## 2023-03-17 NOTE — Progress Notes (Signed)
virtual Visit via Telephone Note  I connected with Meghan Acosta on 03/17/23 at  2:30 PM EST by telephone and verified that I am speaking with the correct person using two identifiers.  Location: Patient: Home Provider: Home Office   I discussed the limitations, risks, security and privacy concerns of performing an evaluation and management service by telephone and the availability of in person appointments. I also discussed with the patient that there may be a patient responsible charge related to this service. The patient expressed understanding and agreed to proceed.  History of Present Illness: Patient is a 48 year old female who is followed by hematology for chronic leukopenia.  She was last seen in clinic on 09/09/2022 by me.  Denies any interval hospitalizations, surgeries or changes to her baseline health.  Hematology workup from 02/10/2022 essentially showed negative workup with negative SPEP, normal LDH, no current infections, no nutritional deficiencies with WBC 3.3 and ANC of 900.  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  Recommended every 102-month labs.  We discussed at her last visit starting oral iron given ferritin was 43.She did not start iron supplements but did try and incorporate more iron in her diet.   Reports her daughter just recently was diagnosed with some kind immunocompromised state and she thinks also anemia. She was started on iron supplements as well.   Recently treated for sinus infection with antibiotics. Completed antibiotics a few days ago. Reports increased drenching night sweats over the past 2 weeks where she has had to change her clothes and sheets. No weight loss or change in appetite.    Observations/Objective:Review of Systems  Constitutional:  Positive for malaise/fatigue. Negative for weight loss.  HENT:  Positive for congestion and sinus pain.   Neurological:  Positive for sensory change.  Psychiatric/Behavioral:  The patient is  nervous/anxious.    Physical Exam Neurological:     Mental Status: She is alert and oriented to person, place, and time.    Assessment and Plan: 1. Chronic leukopenia (Primary) -Labs from 03/14/2023 show white blood cell count of 5.9 and a normal differential. -The last 2 lab draws have been shown WBC within normal limits. -Recently treated for a sinus infection with antibiotics.   2. Iron deficiency anemia, unspecified iron deficiency anemia type -At her last visit we added on iron levels and her ferritin was 43.  It has improved slightly from most recent labs to 48. Recommend starting iron tabs daily. TIBC is still elevated at 484 indicating likely IDA. -She denies any bleeding. -Patient had colonoscopy back in November 2015 which did not reveal any evidence of polyps or hemorrhoids.  Follow Up Instructions: RTC in 3-4 months with labs a few days before and office visit.    I discussed the assessment and treatment plan with the patient. The patient was provided an opportunity to ask questions and all were answered. The patient agreed with the plan and demonstrated an understanding of the instructions.   The patient was advised to call back or seek an in-person evaluation if the symptoms worsen or if the condition fails to improve as anticipated.  I provided 20 minutes of non-face-to-face time during this encounter.   Mauro Kaufmann, NP

## 2023-05-17 DIAGNOSIS — R300 Dysuria: Secondary | ICD-10-CM | POA: Diagnosis not present

## 2023-05-17 DIAGNOSIS — N898 Other specified noninflammatory disorders of vagina: Secondary | ICD-10-CM | POA: Diagnosis not present

## 2023-05-24 ENCOUNTER — Ambulatory Visit (INDEPENDENT_AMBULATORY_CARE_PROVIDER_SITE_OTHER): Admitting: Podiatry

## 2023-05-24 ENCOUNTER — Encounter: Payer: Self-pay | Admitting: Podiatry

## 2023-05-24 DIAGNOSIS — M7662 Achilles tendinitis, left leg: Secondary | ICD-10-CM | POA: Diagnosis not present

## 2023-05-24 DIAGNOSIS — M25572 Pain in left ankle and joints of left foot: Secondary | ICD-10-CM

## 2023-05-24 DIAGNOSIS — M76822 Posterior tibial tendinitis, left leg: Secondary | ICD-10-CM | POA: Diagnosis not present

## 2023-05-24 DIAGNOSIS — G8929 Other chronic pain: Secondary | ICD-10-CM | POA: Diagnosis not present

## 2023-05-24 NOTE — Progress Notes (Signed)
 Meghan Acosta presents once again today for pain to the posterior aspect of her left foot.  She states that used to we were able to get a year out of these injections and now it just seems like a few months.  She states that it seems to be getting worse when I Gokul my toes it really hurts in the back back here and it is exquisitely painful.  She is also complaining of weakness.  Objective: Vital signs are stable she is alert oriented x 3.  Having exhausted all conservative therapies and anti-inflammatories and injections she is still having pain with plantarflexion down deep in the posterior process of the talus.  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  Cannot rule out a tendinitis associated with this.  Plan: Discussed etiology pathology conservative surgical therapies at this point I am recommending another MRI for follow-up.  Radiographs do demonstrate what appears to be a step-off of the posterior process.  All of their conservative therapies have failed surgical therapy is the only thing remaining other than injections.

## 2023-05-25 ENCOUNTER — Encounter: Payer: Self-pay | Admitting: Podiatry

## 2023-05-28 ENCOUNTER — Ambulatory Visit
Admission: RE | Admit: 2023-05-28 | Discharge: 2023-05-28 | Discharge status: Home / Self Care | Source: Ambulatory Visit | Attending: Podiatry | Admitting: Podiatry

## 2023-05-28 DIAGNOSIS — M7662 Achilles tendinitis, left leg: Secondary | ICD-10-CM

## 2023-05-28 DIAGNOSIS — M76822 Posterior tibial tendinitis, left leg: Secondary | ICD-10-CM

## 2023-05-28 DIAGNOSIS — G8929 Other chronic pain: Secondary | ICD-10-CM

## 2023-06-10 DIAGNOSIS — Z1231 Encounter for screening mammogram for malignant neoplasm of breast: Secondary | ICD-10-CM | POA: Diagnosis not present

## 2023-06-13 ENCOUNTER — Ambulatory Visit: Payer: Self-pay | Admitting: Podiatry

## 2023-06-23 ENCOUNTER — Inpatient Hospital Stay: Attending: Hematology

## 2023-06-23 ENCOUNTER — Inpatient Hospital Stay: Payer: BC Managed Care – PPO

## 2023-06-23 DIAGNOSIS — D509 Iron deficiency anemia, unspecified: Secondary | ICD-10-CM | POA: Diagnosis not present

## 2023-06-23 DIAGNOSIS — D72819 Decreased white blood cell count, unspecified: Secondary | ICD-10-CM

## 2023-06-23 DIAGNOSIS — R5383 Other fatigue: Secondary | ICD-10-CM

## 2023-06-23 LAB — IRON AND TIBC
Iron: 129 ug/dL (ref 28–170)
Saturation Ratios: 26 % (ref 10.4–31.8)
TIBC: 489 ug/dL — ABNORMAL HIGH (ref 250–450)
UIBC: 360 ug/dL

## 2023-06-23 LAB — CBC WITH DIFFERENTIAL/PLATELET
Abs Immature Granulocytes: 0 10*3/uL (ref 0.00–0.07)
Basophils Absolute: 0 10*3/uL (ref 0.0–0.1)
Basophils Relative: 1 %
Eosinophils Absolute: 0 10*3/uL (ref 0.0–0.5)
Eosinophils Relative: 1 %
HCT: 38.6 % (ref 36.0–46.0)
Hemoglobin: 12.6 g/dL (ref 12.0–15.0)
Immature Granulocytes: 0 %
Lymphocytes Relative: 62 %
Lymphs Abs: 2.6 10*3/uL (ref 0.7–4.0)
MCH: 31 pg (ref 26.0–34.0)
MCHC: 32.6 g/dL (ref 30.0–36.0)
MCV: 95.1 fL (ref 80.0–100.0)
Monocytes Absolute: 0.3 10*3/uL (ref 0.1–1.0)
Monocytes Relative: 7 %
Neutro Abs: 1.2 10*3/uL — ABNORMAL LOW (ref 1.7–7.7)
Neutrophils Relative %: 29 %
Platelets: 273 10*3/uL (ref 150–400)
RBC: 4.06 MIL/uL (ref 3.87–5.11)
RDW: 12.4 % (ref 11.5–15.5)
WBC: 4.1 10*3/uL (ref 4.0–10.5)
nRBC: 0 % (ref 0.0–0.2)

## 2023-06-23 LAB — VITAMIN D 25 HYDROXY (VIT D DEFICIENCY, FRACTURES): Vit D, 25-Hydroxy: 46.22 ng/mL (ref 30–100)

## 2023-06-23 LAB — VITAMIN B12: Vitamin B-12: 306 pg/mL (ref 180–914)

## 2023-06-23 LAB — FERRITIN: Ferritin: 49 ng/mL (ref 11–307)

## 2023-06-26 LAB — METHYLMALONIC ACID, SERUM: Methylmalonic Acid, Quantitative: 155 nmol/L (ref 0–378)

## 2023-06-28 ENCOUNTER — Ambulatory Visit: Admitting: Podiatry

## 2023-06-30 ENCOUNTER — Inpatient Hospital Stay: Payer: BC Managed Care – PPO | Attending: Hematology | Admitting: Oncology

## 2023-06-30 DIAGNOSIS — D509 Iron deficiency anemia, unspecified: Secondary | ICD-10-CM | POA: Diagnosis not present

## 2023-06-30 DIAGNOSIS — D72819 Decreased white blood cell count, unspecified: Secondary | ICD-10-CM | POA: Diagnosis not present

## 2023-06-30 DIAGNOSIS — R5383 Other fatigue: Secondary | ICD-10-CM

## 2023-06-30 NOTE — Progress Notes (Signed)
 virtual Visit via Telephone Note  I connected with Meghan Acosta on 07/05/23 at  1:30 PM EDT by telephone and verified that I am speaking with the correct person using two identifiers.  Location: Patient: Home Provider: Home Office   I discussed the limitations, risks, security and privacy concerns of performing an evaluation and management service by telephone and the availability of in person appointments. I also discussed with the patient that there may be a patient responsible charge related to this service. The patient expressed understanding and agreed to proceed.  History of Present Illness: Patient is a 48 year old female who is followed by hematology for chronic leukopenia.  She was last seen in clinic on 03/17/23 by me.  Denies any interval hospitalizations, surgeries or changes to her baseline health.  Hematology workup from 02/10/2022 essentially showed negative workup with negative SPEP, normal LDH, no current infections, no nutritional deficiencies with WBC 3.3 and ANC of 900.  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  Recommended every 4-month labs.  She started herself on oral iron tablets and has been tolerating them well.  She continues to try to incorporate more iron in her diet.  Reports she has been very busy and feels like that may be why she has felt so tired.  Her grandmother died in 12-May-2023.  Recently treated for sinus infection with antibiotics. Completed antibiotics a few days ago. Reports increased drenching night sweats over the past 2 weeks where she has had to change her clothes and sheets. No weight loss or change in appetite.    Observations/Objective:Review of Systems  Constitutional:  Positive for malaise/fatigue.  Cardiovascular:  Positive for chest pain.  Neurological:  Positive for tingling and sensory change.   Physical Exam Neurological:     Mental Status: She is alert and oriented to person, place, and time.     Assessment and  Plan: 1. Chronic leukopenia (Primary) -Labs from 06/23/2023 show white blood cell count of 4.1 and a normal differential. -The last 2 lab draws have been shown WBC within normal limits. -Will continue to monitor.  2. Iron deficiency anemia, unspecified iron deficiency anemia type - She has been taking oral iron tablets daily with good tolerance. -Most recent labs from 06/23/2023 show a ferritin of 49 (48), iron saturation 26% (20%) and an elevated TIBC 489 (484). -She denies any bleeding. -Patient had colonoscopy back in November 2015 which did not reveal any evidence of polyps or hemorrhoids. -Recommend she continue oral iron for now.  Follow Up Instructions: RTC in 3-4 months with labs a few days before and office visit.    I discussed the assessment and treatment plan with the patient. The patient was provided an opportunity to ask questions and all were answered. The patient agreed with the plan and demonstrated an understanding of the instructions.   The patient was advised to call back or seek an in-person evaluation if the symptoms worsen or if the condition fails to improve as anticipated.  I provided 20 minutes of non-face-to-face time during this encounter.   Aurther Blue, NP

## 2023-07-13 ENCOUNTER — Other Ambulatory Visit (HOSPITAL_COMMUNITY): Payer: Self-pay | Admitting: Nurse Practitioner

## 2023-07-13 ENCOUNTER — Encounter (HOSPITAL_COMMUNITY): Payer: Self-pay | Admitting: Nurse Practitioner

## 2023-07-13 DIAGNOSIS — R109 Unspecified abdominal pain: Secondary | ICD-10-CM | POA: Diagnosis not present

## 2023-07-13 DIAGNOSIS — E785 Hyperlipidemia, unspecified: Secondary | ICD-10-CM | POA: Diagnosis not present

## 2023-07-14 ENCOUNTER — Encounter: Payer: Self-pay | Admitting: Podiatry

## 2023-07-14 ENCOUNTER — Ambulatory Visit (INDEPENDENT_AMBULATORY_CARE_PROVIDER_SITE_OTHER)

## 2023-07-14 ENCOUNTER — Ambulatory Visit (INDEPENDENT_AMBULATORY_CARE_PROVIDER_SITE_OTHER): Admitting: Podiatry

## 2023-07-14 DIAGNOSIS — M19072 Primary osteoarthritis, left ankle and foot: Secondary | ICD-10-CM

## 2023-07-14 DIAGNOSIS — M7752 Other enthesopathy of left foot: Secondary | ICD-10-CM | POA: Diagnosis not present

## 2023-07-15 ENCOUNTER — Encounter: Payer: Self-pay | Admitting: Podiatry

## 2023-07-18 NOTE — Progress Notes (Unsigned)
 She presents today for follow-up of her MRI of her left foot.  She states that the pain continues to be sharp and occasionally debilitating to the posterior lateral aspect of her left heel/ankle.  Objective: Vital signs are stable alert and oriented x 3.  MRI demonstrates no significant osseous abnormalities in the rear foot however reviewing previous MRIs and radiographs does demonstrate what appears to be some subtalar joint effusion.  Radiographs were taken once again today lateral view and does not demonstrate any type of osseous abnormality however that soft tissue effusion appears to still be present.  She has pain on palpation of the sinus tarsi and sharp plantarflexion consistent with subtalar joint capsulitis and posterior impingement syndrome.  Assessment: Posterior impingement syndrome subtalar joint capsulitis  Plan: I injected the subtalar joint today with Kenalog  and local anesthetic Ragona see if this helps.  I am wondering if she may have some early osteoarthritic change in that posterior process of her subtalar joint.

## 2023-07-19 DIAGNOSIS — M7752 Other enthesopathy of left foot: Secondary | ICD-10-CM | POA: Diagnosis not present

## 2023-07-19 MED ORDER — TRIAMCINOLONE ACETONIDE 40 MG/ML IJ SUSP
20.0000 mg | Freq: Once | INTRAMUSCULAR | Status: AC
  Administered 2023-07-19: 20 mg

## 2023-07-20 ENCOUNTER — Ambulatory Visit (HOSPITAL_COMMUNITY)
Admission: RE | Admit: 2023-07-20 | Discharge: 2023-07-20 | Disposition: A | Discharge status: Home / Self Care | Source: Ambulatory Visit | Attending: Nurse Practitioner | Admitting: Nurse Practitioner

## 2023-07-20 DIAGNOSIS — M160 Bilateral primary osteoarthritis of hip: Secondary | ICD-10-CM | POA: Diagnosis not present

## 2023-07-20 DIAGNOSIS — M25559 Pain in unspecified hip: Secondary | ICD-10-CM | POA: Diagnosis not present

## 2023-07-20 DIAGNOSIS — R109 Unspecified abdominal pain: Secondary | ICD-10-CM | POA: Diagnosis not present

## 2023-07-20 DIAGNOSIS — K5900 Constipation, unspecified: Secondary | ICD-10-CM | POA: Diagnosis not present

## 2023-07-21 DIAGNOSIS — D708 Other neutropenia: Secondary | ICD-10-CM | POA: Diagnosis not present

## 2023-07-21 DIAGNOSIS — D509 Iron deficiency anemia, unspecified: Secondary | ICD-10-CM | POA: Diagnosis not present

## 2023-07-21 DIAGNOSIS — R944 Abnormal results of kidney function studies: Secondary | ICD-10-CM | POA: Diagnosis not present

## 2023-07-21 DIAGNOSIS — E785 Hyperlipidemia, unspecified: Secondary | ICD-10-CM | POA: Diagnosis not present

## 2023-08-11 ENCOUNTER — Encounter (INDEPENDENT_AMBULATORY_CARE_PROVIDER_SITE_OTHER): Payer: Self-pay | Admitting: *Deleted

## 2023-08-22 ENCOUNTER — Other Ambulatory Visit (HOSPITAL_COMMUNITY): Payer: Self-pay | Admitting: Nurse Practitioner

## 2023-08-22 DIAGNOSIS — R222 Localized swelling, mass and lump, trunk: Secondary | ICD-10-CM

## 2023-08-22 DIAGNOSIS — K429 Umbilical hernia without obstruction or gangrene: Secondary | ICD-10-CM | POA: Diagnosis not present

## 2023-08-25 ENCOUNTER — Ambulatory Visit (HOSPITAL_COMMUNITY)

## 2023-08-29 ENCOUNTER — Ambulatory Visit (HOSPITAL_COMMUNITY)
Admission: RE | Admit: 2023-08-29 | Discharge: 2023-08-29 | Disposition: A | Discharge status: Home / Self Care | Source: Ambulatory Visit | Attending: Nurse Practitioner | Admitting: Nurse Practitioner

## 2023-08-29 DIAGNOSIS — K429 Umbilical hernia without obstruction or gangrene: Secondary | ICD-10-CM | POA: Insufficient documentation

## 2023-08-31 ENCOUNTER — Encounter (INDEPENDENT_AMBULATORY_CARE_PROVIDER_SITE_OTHER): Payer: Self-pay | Admitting: Gastroenterology

## 2023-08-31 ENCOUNTER — Ambulatory Visit (INDEPENDENT_AMBULATORY_CARE_PROVIDER_SITE_OTHER): Admitting: Gastroenterology

## 2023-08-31 VITALS — BP 112/69 | HR 84 | Temp 97.8°F | Ht 67.0 in | Wt 154.4 lb

## 2023-08-31 DIAGNOSIS — R140 Abdominal distension (gaseous): Secondary | ICD-10-CM | POA: Diagnosis not present

## 2023-08-31 DIAGNOSIS — K5904 Chronic idiopathic constipation: Secondary | ICD-10-CM | POA: Insufficient documentation

## 2023-08-31 DIAGNOSIS — K219 Gastro-esophageal reflux disease without esophagitis: Secondary | ICD-10-CM | POA: Diagnosis not present

## 2023-08-31 DIAGNOSIS — K581 Irritable bowel syndrome with constipation: Secondary | ICD-10-CM | POA: Insufficient documentation

## 2023-08-31 DIAGNOSIS — R1013 Epigastric pain: Secondary | ICD-10-CM

## 2023-08-31 DIAGNOSIS — K5900 Constipation, unspecified: Secondary | ICD-10-CM | POA: Diagnosis not present

## 2023-08-31 MED ORDER — PANTOPRAZOLE SODIUM 40 MG PO TBEC: 40.0000 mg | DELAYED_RELEASE_TABLET | Freq: Every day | ORAL | 3 refills | Status: AC

## 2023-08-31 MED ORDER — POLYETHYLENE GLYCOL 3350 17 G PO PACK: 17.0000 g | PACK | Freq: Two times a day (BID) | ORAL | 0 refills | Status: AC

## 2023-08-31 MED ORDER — PSYLLIUM 58.6 % PO PACK: 1.0000 | PACK | Freq: Two times a day (BID) | ORAL | 2 refills | Status: DC

## 2023-08-31 NOTE — Patient Instructions (Signed)
 It was very nice to meet you today, as dicussed with will plan for the following :  1)Ensure adequate fluid intake: Aim for 8 glasses of water  daily. Follow a high fiber diet: Include foods such as dates, prunes, pears, and kiwi. Take Miralax  twice a day for the first week, then reduce to once daily thereafter. Use Metamucil twice a day.  2) try linzess  3) Protonix  40mg  , 30 min before breakfast for 6-8 weeks  4) IB guard 1-2 times daily

## 2023-08-31 NOTE — Progress Notes (Signed)
 Meghan Acosta , M.D. Gastroenterology & Hepatology [facility] Gastro[facility] [address] [place], [place] [address] Primary Care Physician: Joeann Browning, FNP [address] [place] [address]  Chief Complaint: Abdominal bloating/discomfort/constipation/GERD  History of Present Illness:  Meghan Acosta is a 48 y.o. female with no significant medical problems who presents for evaluation of Abdominal bloating/discomfort/constipation/GERD  Patient reports that recently she has noticed significant bloating and abdominal pain located on her abdomen.  She was given MiraLAX  which she took once daily for a week with some relief of her symptoms.  Patient now reports having a Bristol stool scale bowel movement 2-3 every day without straining  Patient reports epigastric burning sensation with nighttime awakening due to heartburn recently as well The patient denies having any nausea, vomiting, fever, chills, hematochezia, melena, hematemesis, diarrhea, jaundice, pruritus or weight loss.  Last labs from July 2022 iron saturation 26 Hemoglobin 12.6 platelet 273 6 Ferritin 49  Last ZHI:wnwz  Last Colonoscopy: 2024 -patient reports having negative colonoscopy at [facility] and next due in 10 years 2015 , negative ileo-colonoscopy   FHx: neg for any gastrointestinal/liver disease, no malignancies Social: neg smoking, alcohol or illicit drug use Surgical: no abdominal surgeries  Past Medical History: Past Medical History:  Diagnosis Date   Allergy     Past Surgical History: Past Surgical History:  Procedure Laterality Date   COLONOSCOPY N/A 12/05/2013   Procedure: COLONOSCOPY;  Surgeon: Lamar CHRISTELLA Hollingshead, MD;  [facility];  Service: Endoscopy;  Laterality: N/A;  1045   FOOT SURGERY     WISDOM TOOTH EXTRACTION      Family History: Family History  Problem Relation Age of Onset   Cervical cancer Paternal Aunt    Colon cancer  Neg Hx    Inflammatory bowel disease Neg Hx     Social History: Social History   Tobacco Use  Smoking Status Never  Smokeless Tobacco Never   Social History   Substance and Sexual Activity  Alcohol Use No   Social History   Substance and Sexual Activity  Drug Use No    Allergies: No Known Allergies  Medications: Current Outpatient Medications  Medication Sig Dispense Refill   Ascorbic Acid (VITAMIN C) 1000 MG tablet Take 1,000 mg by mouth daily.     Ferrous Sulfate (SLOW FE PO) Take by mouth. (Patient taking differently: Take by mouth every other day.)     Meghan Acosta 24 FE 1-20 MG-MCG(24) tablet Take 1 tablet by mouth daily.     No current facility-administered medications for this visit.    Review of Systems: GENERAL: negative for malaise, night sweats HEENT: No changes in hearing or vision, no nose bleeds or other nasal problems. NECK: Negative for lumps, goiter, pain and significant neck swelling RESPIRATORY: Negative for cough, wheezing CARDIOVASCULAR: Negative for chest pain, leg swelling, palpitations, orthopnea GI: SEE HPI MUSCULOSKELETAL: Negative for joint pain or swelling, back pain, and muscle pain. SKIN: Negative for lesions, rash HEMATOLOGY Negative for prolonged bleeding, bruising easily, and swollen nodes. ENDOCRINE: Negative for cold or heat intolerance, polyuria, polydipsia and goiter. NEURO: negative for tremor, gait imbalance, syncope and seizures. The remainder of the review of systems is noncontributory.   Physical Exam: BP 112/69 (BP Location: Left Arm, Patient Position: Sitting, Cuff Size: Normal)   Pulse 84   Temp 97.8 F (36.6 C) (Temporal)   Ht 5' 7 (1.702 m)   Wt 154 lb 6.4 oz (70 kg)   BMI  24.18 kg/m  GENERAL: The patient is AO x3, in no acute distress. HEENT: Head is normocephalic and atraumatic. EOMI are intact. Mouth is well hydrated and without lesions. NECK: Supple. No masses LUNGS: Clear to auscultation. No presence of  rhonchi/wheezing/rales. Adequate chest expansion HEART: RRR, normal s1 and s2. ABDOMEN: Soft, mild right side of the abdomen tenderness, no guarding, no peritoneal signs, and nondistended. BS +. No masses.   Imaging/Labs: as above     Latest Ref Rng & Units 06/23/2023    9:06 AM 03/14/2023    9:39 AM 09/02/2022    2:01 PM  CBC  WBC 4.0 - 10.5 K/uL 4.1  5.9  4.7   Hemoglobin 12.0 - 15.0 g/dL 87.3  87.6  88.1   Hematocrit 36.0 - 46.0 % 38.6  36.7  35.2   Platelets 150 - 400 K/uL 273  277  248    Lab Results  Component Value Date   IRON 129 06/23/2023   TIBC 489 (H) 06/23/2023   FERRITIN 49 06/23/2023    I personally reviewed and interpreted the available labs, imaging and endoscopic files.  Impression and Plan:  Meghan Acosta is a 48 y.o. 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 Follow a high fiber diet: Include foods such as dates, prunes, pears, and kiwi. Take Miralax  twice a day for the first week, then reduce to once daily thereafter. Use Metamucil twice a day.  Samples of Linzess ; and if patient symptoms improves will prescribe it   - Explained presumed etiology of IBS symptoms. Patient was counseled about the benefit of implementing a low FODMAP to improve symptoms and recurrent episodes. A dietary list was provided to the patient. Also, the patient was counseled about the benefit of avoiding stressing situations and potential environmental triggers leading to symptomatology.  - Start IBGard 1 tablet every 8-12 hours as  needed -Protonix  40mg  , 30 min before breakfast for 8 weeks   If above does not help patient may obtain cross-sectional imaging in future  All questions were answered.      Lyriq Finerty Faizan Thelton Graca, MD Gastroenterology and Hepatology [facility]   This chart has been completed using [facility] Dictation software, and while attempts have been made to ensure accuracy , certain words and phrases may not be transcribed as intended

## 2023-09-06 ENCOUNTER — Telehealth (INDEPENDENT_AMBULATORY_CARE_PROVIDER_SITE_OTHER): Payer: Self-pay | Admitting: Gastroenterology

## 2023-09-06 NOTE — Telephone Encounter (Signed)
 Pt contacted and verbalized understanding.

## 2023-09-06 NOTE — Telephone Encounter (Signed)
 She is on right medications for now . She can also try Gas-X

## 2023-09-06 NOTE — Telephone Encounter (Signed)
 Pt left voicemail stating she has questions about some of her medications. Returned call to pt. Pt states she is taking the miralax , fiber gummies, linzess and IB guard as directed. Pt is wanting to know if there is anything else she can take other than Linzess while she is at work. Pt states she is uncomfortable with the gas and bloating. Please advise. Thank you!

## 2023-09-08 ENCOUNTER — Ambulatory Visit: Admitting: Surgery

## 2023-09-09 ENCOUNTER — Encounter (INDEPENDENT_AMBULATORY_CARE_PROVIDER_SITE_OTHER): Payer: Self-pay

## 2023-09-09 ENCOUNTER — Telehealth (INDEPENDENT_AMBULATORY_CARE_PROVIDER_SITE_OTHER): Payer: Self-pay | Admitting: Gastroenterology

## 2023-09-09 NOTE — Telephone Encounter (Signed)
 Pt left voicemail asking for a return call from nurse. Returned call to pt. Pt is wanting to know if there is something she can take only on the weekends to help her. Pt states she is uncomfortable at work and is having to pull away from desk to go to bathroom multiple times a day. Pt states at this time she is taking Miralax  daily, chewable fiber gummies and GasX. Taking Linzess only on weekends. Pt having mixed stools, sometime loose and sometime formed. Going about 3-5 times on average a day. Pt wanted to know if she was to be eating certain foods. I asked pt if she still had the FodMap diet that was given with her AVS and pt states she must have over looked it. I have sent low fodmap diet via my chart to her.  Please advise. Thank you

## 2023-09-16 ENCOUNTER — Telehealth (INDEPENDENT_AMBULATORY_CARE_PROVIDER_SITE_OTHER): Payer: Self-pay | Admitting: Gastroenterology

## 2023-09-16 DIAGNOSIS — K581 Irritable bowel syndrome with constipation: Secondary | ICD-10-CM

## 2023-09-16 NOTE — Telephone Encounter (Signed)
 Pt left message wanting to speak to medical assistant of Dr.Ahmed about a medication that has ran out and if we were going to send in a script or try something different. Returned call to pt. Pt was given sample of Linzess 145 at office visit. Pt was taking them on the weekends only but this past week began taking them daily. Pt states she believes she may still be constipated. Last BM was this morning and pt states it was small break apart. Please advise. Thank you!  [facility].

## 2023-09-20 MED ORDER — LINACLOTIDE 290 MCG PO CAPS: 290.0000 ug | ORAL_CAPSULE | Freq: Every day | ORAL | 1 refills | Status: DC

## 2023-09-20 NOTE — Addendum Note (Signed)
 Addended by: CINDERELLA DEATRICE SMILES on: 09/20/2023 11:39 AM   Modules accepted: Orders

## 2023-09-20 NOTE — Telephone Encounter (Signed)
 If she is still constipated on Linzess  I am prescribing 290mcg

## 2023-09-20 NOTE — Telephone Encounter (Signed)
 Pt contacted. Pt states she is still having gritty like bowel movement. Not having normal bowel movements. Advised pt that provider sent in Linzess  290 mcg and to take that daily at the same time each morning. Advised pt to let us  know if the higher dose does not help. Pt verbalized understanding.

## 2023-09-22 ENCOUNTER — Encounter: Payer: Self-pay | Admitting: General Surgery

## 2023-09-22 ENCOUNTER — Ambulatory Visit: Admitting: General Surgery

## 2023-09-22 VITALS — BP 107/74 | HR 73 | Temp 97.6°F | Resp 12 | Ht 67.0 in | Wt 151.0 lb

## 2023-09-22 DIAGNOSIS — K429 Umbilical hernia without obstruction or gangrene: Secondary | ICD-10-CM | POA: Diagnosis not present

## 2023-09-22 NOTE — Progress Notes (Signed)
 Meghan Acosta; [id number]; 01-20-1976   HPI Patient is a 48 year old black female who was referred to my care by [name] for evaluation and treatment of an umbilical hernia.  Patient states that she has had an umbilical hernia for several months.  It is only made worse with straining.  Occasional pain is noted when it sticks out.  She is able to reduce it on its own.  No nausea or vomiting have been noted. Past Medical History:  Diagnosis Date   Allergy     Past Surgical History:  Procedure Laterality Date   COLONOSCOPY N/A 12/05/2013   Procedure: COLONOSCOPY;  Surgeon: Lamar CHRISTELLA Hollingshead, MD;  [facility];  Service: Endoscopy;  Laterality: N/A;  1045   FOOT SURGERY     WISDOM TOOTH EXTRACTION      Family History  Problem Relation Age of Onset   Cervical cancer Paternal Aunt    Colon cancer Neg Hx    Inflammatory bowel disease Neg Hx     Current Outpatient Medications on File Prior to Visit  Medication Sig Dispense Refill   Ascorbic Acid (VITAMIN C) 1000 MG tablet Take 1,000 mg by mouth daily.     LARIN 24 FE 1-20 MG-MCG(24) tablet Take 1 tablet by mouth daily.     linaclotide  (LINZESS ) 290 MCG CAPS capsule Take 1 capsule (290 mcg total) by mouth daily before breakfast. 90 capsule 1   pantoprazole  (PROTONIX ) 40 MG tablet Take 1 tablet (40 mg total) by mouth daily. 60 tablet 3   polyethylene glycol (MIRALAX  / GLYCOLAX ) 17 g packet Take 17 g by mouth 2 (two) times daily. 180 packet 0   No current facility-administered medications on file prior to visit.    No Known Allergies  Social History   Substance and Sexual Activity  Alcohol Use No    Social History   Tobacco Use  Smoking Status Never  Smokeless Tobacco Never    Review of Systems  Constitutional: Negative.   HENT: Negative.    Eyes: Negative.   Respiratory: Negative.    Cardiovascular: Negative.   Gastrointestinal:  Positive for abdominal pain.  Genitourinary: Negative.    Musculoskeletal: Negative.   Skin: Negative.   Neurological: Negative.   Endo/Heme/Allergies: Negative.   Psychiatric/Behavioral: Negative.      Objective   Vitals:   09/22/23 0904  BP: 107/74  Pulse: 73  Resp: 12  Temp: 97.6 F (36.4 C)  SpO2: 98%    Physical Exam Vitals reviewed.  Constitutional:      Appearance: Normal appearance. She is normal weight. She is not ill-appearing.  HENT:     Head: Normocephalic and atraumatic.  Cardiovascular:     Rate and Rhythm: Normal rate and regular rhythm.     Heart sounds: Normal heart sounds. No murmur heard.    No friction rub. No gallop.  Pulmonary:     Effort: Pulmonary effort is normal. No respiratory distress.     Breath sounds: Normal breath sounds. No stridor. No wheezing, rhonchi or rales.  Abdominal:     General: Abdomen is flat. Bowel sounds are normal. There is no distension.     Palpations: Abdomen is soft. There is no mass.     Tenderness: There is no abdominal tenderness. There is no guarding or rebound.     Hernia: A hernia is present.     Comments: 1.5 cm umbilical hernia with laxity of the surrounding muscle wall.  Easily reducible.  Skin:  General: Skin is warm and dry.  Neurological:     Mental Status: She is alert and oriented to person, place, and time.   Ultrasound report reviewed  Assessment  Umbilical hernia Plan  Patient will call to schedule a robotic assisted laparoscopic umbilical herniorrhaphy with mesh.  The risks and benefits of the procedure including bleeding, infection, mesh use, and the possibility of recurrence of the hernia were fully explained to the patient, who gave informed consent.

## 2023-10-03 ENCOUNTER — Telehealth: Payer: Self-pay | Admitting: *Deleted

## 2023-10-03 DIAGNOSIS — K429 Umbilical hernia without obstruction or gangrene: Secondary | ICD-10-CM

## 2023-10-03 NOTE — Telephone Encounter (Signed)
 Received call from patient [phone]~ telephone.   Patient requested to scheduled hernia repair in November 2025.

## 2023-11-21 ENCOUNTER — Telehealth (INDEPENDENT_AMBULATORY_CARE_PROVIDER_SITE_OTHER): Payer: Self-pay | Admitting: Gastroenterology

## 2023-11-21 NOTE — Telephone Encounter (Signed)
 Pt left message asking for a call back due to having continued symptoms. Returned call to pt. Pt states she is still taking her Linzess  and Protonix  but is still bloated and gassy in the mornings. States sometimes the Linzess  helps sometimes it feels as if it is not helping. No constipation, no diarrhea. Pt states she is very uncomfortable in the mornings. Pt states sometimes she is not able to eat in the mornings due to being uncomfortable. Pt is due for office visit. Pt is scheduled for hernia repair on 12/09/23. Please advise. Thank you.

## 2023-11-23 NOTE — H&P (Signed)
 Meghan Acosta; [id number]; 01-20-1976   HPI Patient is a 48 year old black female who was referred to my care by [name] for evaluation and treatment of an umbilical hernia.  Patient states that she has had an umbilical hernia for several months.  It is only made worse with straining.  Occasional pain is noted when it sticks out.  She is able to reduce it on its own.  No nausea or vomiting have been noted. Past Medical History:  Diagnosis Date   Allergy     Past Surgical History:  Procedure Laterality Date   COLONOSCOPY N/A 12/05/2013   Procedure: COLONOSCOPY;  Surgeon: Lamar CHRISTELLA Hollingshead, MD;  [facility];  Service: Endoscopy;  Laterality: N/A;  1045   FOOT SURGERY     WISDOM TOOTH EXTRACTION      Family History  Problem Relation Age of Onset   Cervical cancer Paternal Aunt    Colon cancer Neg Hx    Inflammatory bowel disease Neg Hx     Current Outpatient Medications on File Prior to Visit  Medication Sig Dispense Refill   Ascorbic Acid (VITAMIN C) 1000 MG tablet Take 1,000 mg by mouth daily.     LARIN 24 FE 1-20 MG-MCG(24) tablet Take 1 tablet by mouth daily.     linaclotide  (LINZESS ) 290 MCG CAPS capsule Take 1 capsule (290 mcg total) by mouth daily before breakfast. 90 capsule 1   pantoprazole  (PROTONIX ) 40 MG tablet Take 1 tablet (40 mg total) by mouth daily. 60 tablet 3   polyethylene glycol (MIRALAX  / GLYCOLAX ) 17 g packet Take 17 g by mouth 2 (two) times daily. 180 packet 0   No current facility-administered medications on file prior to visit.    No Known Allergies  Social History   Substance and Sexual Activity  Alcohol Use No    Social History   Tobacco Use  Smoking Status Never  Smokeless Tobacco Never    Review of Systems  Constitutional: Negative.   HENT: Negative.    Eyes: Negative.   Respiratory: Negative.    Cardiovascular: Negative.   Gastrointestinal:  Positive for abdominal pain.  Genitourinary: Negative.    Musculoskeletal: Negative.   Skin: Negative.   Neurological: Negative.   Endo/Heme/Allergies: Negative.   Psychiatric/Behavioral: Negative.      Objective   Vitals:   09/22/23 0904  BP: 107/74  Pulse: 73  Resp: 12  Temp: 97.6 F (36.4 C)  SpO2: 98%    Physical Exam Vitals reviewed.  Constitutional:      Appearance: Normal appearance. She is normal weight. She is not ill-appearing.  HENT:     Head: Normocephalic and atraumatic.  Cardiovascular:     Rate and Rhythm: Normal rate and regular rhythm.     Heart sounds: Normal heart sounds. No murmur heard.    No friction rub. No gallop.  Pulmonary:     Effort: Pulmonary effort is normal. No respiratory distress.     Breath sounds: Normal breath sounds. No stridor. No wheezing, rhonchi or rales.  Abdominal:     General: Abdomen is flat. Bowel sounds are normal. There is no distension.     Palpations: Abdomen is soft. There is no mass.     Tenderness: There is no abdominal tenderness. There is no guarding or rebound.     Hernia: A hernia is present.     Comments: 1.5 cm umbilical hernia with laxity of the surrounding muscle wall.  Easily reducible.  Skin:  General: Skin is warm and dry.  Neurological:     Mental Status: She is alert and oriented to person, place, and time.   Ultrasound report reviewed  Assessment  Umbilical hernia Plan  Patient will call to schedule a robotic assisted laparoscopic umbilical herniorrhaphy with mesh.  The risks and benefits of the procedure including bleeding, infection, mesh use, and the possibility of recurrence of the hernia were fully explained to the patient, who gave informed consent.

## 2023-11-25 ENCOUNTER — Encounter (INDEPENDENT_AMBULATORY_CARE_PROVIDER_SITE_OTHER): Payer: Self-pay

## 2023-11-25 NOTE — Telephone Encounter (Signed)
 Please advice patient to follow up in the clinic and we may obtain imaging . For now continue protonix , IB Guard and linzess 

## 2023-11-25 NOTE — Telephone Encounter (Signed)
 My chart message sent to patient.  Devere, would you call the patient and schedule her for a follow up? Thank you!!

## 2023-11-25 NOTE — Telephone Encounter (Signed)
 Noted

## 2023-11-28 ENCOUNTER — Ambulatory Visit (INDEPENDENT_AMBULATORY_CARE_PROVIDER_SITE_OTHER): Admitting: Gastroenterology

## 2023-11-28 ENCOUNTER — Encounter (INDEPENDENT_AMBULATORY_CARE_PROVIDER_SITE_OTHER): Payer: Self-pay | Admitting: Gastroenterology

## 2023-11-28 VITALS — BP 116/76 | HR 87 | Temp 98.0°F | Ht 67.0 in | Wt 155.3 lb

## 2023-11-28 DIAGNOSIS — K581 Irritable bowel syndrome with constipation: Secondary | ICD-10-CM

## 2023-11-28 DIAGNOSIS — R140 Abdominal distension (gaseous): Secondary | ICD-10-CM | POA: Diagnosis not present

## 2023-11-28 DIAGNOSIS — K219 Gastro-esophageal reflux disease without esophagitis: Secondary | ICD-10-CM | POA: Diagnosis not present

## 2023-11-28 MED ORDER — RIFAXIMIN 550 MG PO TABS: 550.0000 mg | ORAL_TABLET | Freq: Three times a day (TID) | ORAL | 0 refills | Status: DC

## 2023-11-28 NOTE — Patient Instructions (Signed)
 Continue with linzess  290mcg daily You can try starting daily over the counter probiotic We will try to start xifaxan to see if you respond as there may be a bacterial overgrowth at play here (start this after your hernia repair) Increase water  intake, aim for atleast 64 oz per day Increase fruits, veggies and whole grains, kiwi and prunes are especially good for constipation Continue protonix  40mg  daily  Follow up 3 months   It was a pleasure to see you today. I want to create trusting relationships with patients and provide genuine, compassionate, and quality care. I truly value your feedback! please be on the lookout for a survey regarding your visit with me today. I appreciate your input about our visit and your time in completing this!    Teddie Curd L. Carney Saxton, MSN, APRN, AGNP-C Adult-Gerontology Nurse Practitioner [facility] at [facility]

## 2023-11-28 NOTE — Progress Notes (Signed)
 Referring Provider: Joeann Browning, FNP Primary Care Physician:  Joeann Browning, FNP Primary GI Physician: Dr. Cinderella  Chief Complaint  Patient presents with   [facility]    Patient here today due to issues with bloating and gas. She is taking Linzess  290 mcg once per day,and pantoprazole  40 mg daily.   HPI:   Meghan Acosta is a 48 y.o. female with no significant past medical history   Patient presenting today for:  IBS with Constipation, bloating, GERD  Last seen in August, by Dr. Cinderella, at that time having bloating, abdominal pain, some improvement with miralax , reported epigastric burning as well.   Recommended miralax  and metamucil, samples of linzess  provided, start ibgard, protonix  40mg  daily   Recent US  abd on 8/4 with Small fat containing LEFT periumbilical hernia. Minimal fluid present within the hernia sac is seen more often with incarceration. Please correlate with patient's symptoms.  Seen by gen surgery on 10/31 and recommended to have lap umbilical herniorrhaphy with mesh  Present:  States she is still having a lot of gas, bloating. She is having a BM 2-4 times per day, she states that she has not been drinking much water  recently and had harder stools but typically has more solid but softer stools. She denies any abdominal pain. She is taking gas x which she is not sure if this was helping. She is passing gas. Denies rectal bleeding or melena. She feels that she is emptying out well when she does have a BM.    Taking protonix  40mg  daily, feels GERD is better controlled. Very occasional regurgitation. She has some belching at times depending what she eats or how late.   Last ZHI:wnwz Last Colonoscopy: 2024 -patient reports having negative colonoscopy at [facility] and next due in 10 years  Filed Weights   11/28/23 1547  Weight: 155 lb 4.8 oz (70.4 kg)     Past Medical History:  Diagnosis Date   Allergy     Past Surgical History:  Procedure Laterality  Date   COLONOSCOPY N/A 12/05/2013   Procedure: COLONOSCOPY;  Surgeon: Lamar CHRISTELLA Hollingshead, MD;  [facility];  Service: Endoscopy;  Laterality: N/A;  1045   FOOT SURGERY     WISDOM TOOTH EXTRACTION      Current Outpatient Medications  Medication Sig Dispense Refill   Ascorbic Acid (VITAMIN C) 1000 MG tablet Take 1,000 mg by mouth daily.     LARIN 24 FE 1-20 MG-MCG(24) tablet Take 1 tablet by mouth daily.     linaclotide  (LINZESS ) 290 MCG CAPS capsule Take 1 capsule (290 mcg total) by mouth daily before breakfast. 90 capsule 1   OVER THE COUNTER MEDICATION Fiber gummies 2-3 daily.     pantoprazole  (PROTONIX ) 40 MG tablet Take 1 tablet (40 mg total) by mouth daily. 60 tablet 3   polyethylene glycol (MIRALAX  / GLYCOLAX ) 17 g packet Take 17 g by mouth 2 (two) times daily. (Patient not taking: Reported on 11/28/2023) 180 packet 0   No current facility-administered medications for this visit.    Allergies as of 11/28/2023   (No Known Allergies)    Social History   Socioeconomic History   Marital status: Single    Spouse name: Not on file   Number of children: 0   Years of education: Not on file   Highest education level: Not on file  Occupational History   Occupation: office    Employer: [employer]  Tobacco Use   Smoking status: Never  Smokeless tobacco: Never  Vaping Use   Vaping status: Never Used  Substance and Sexual Activity   Alcohol use: No   Drug use: No   Sexual activity: Yes  Other Topics Concern   Not on file  Social History Narrative   Not on file   Social Drivers of Health   Financial Resource Strain: Not on file  Food Insecurity: Food Insecurity Present (02/10/2022)   Hunger Vital Sign    Worried About Running Out of Food in the Last Year: Sometimes true    Ran Out of Food in the Last Year: Sometimes true  Transportation Needs: No Transportation Needs (02/10/2022)   PRAPARE - Administrator, Civil Service (Medical): No     Lack of Transportation (Non-Medical): No  Physical Activity: Not on file  Stress: Not on file  Social Connections: Not on file    Review of systems General: negative for malaise, night sweats, fever, chills, weight loss Neck: Negative for lumps, goiter, pain and significant neck swelling Resp: Negative for cough, wheezing, dyspnea at rest CV: Negative for chest pain, leg swelling, palpitations, orthopnea GI: denies melena, hematochezia, nausea, vomiting, diarrhea, constipation, dysphagia, odyonophagia, early satiety or unintentional weight loss. +bloating/gas  MSK: Negative for joint pain or swelling, back pain, and muscle pain. Derm: Negative for itching or rash Psych: Denies depression, anxiety, memory loss, confusion. No homicidal or suicidal ideation.  Heme: Negative for prolonged bleeding, bruising easily, and swollen nodes. Endocrine: Negative for cold or heat intolerance, polyuria, polydipsia and goiter. Neuro: negative for tremor, gait imbalance, syncope and seizures. The remainder of the review of systems is noncontributory.  Physical Exam: BP 116/76 (BP Location: Left Arm, Patient Position: Sitting, Cuff Size: Normal)   Pulse 87   Temp 98 F (36.7 C) (Temporal)   Ht 5' 7 (1.702 m)   Wt 155 lb 4.8 oz (70.4 kg)   BMI 24.32 kg/m  General:   Alert and oriented. No distress noted. Pleasant and cooperative.  Head:  Normocephalic and atraumatic. Eyes:  Conjuctiva clear without scleral icterus. Mouth:  Oral mucosa pink and moist. Good dentition. No lesions. Heart: Normal rate and rhythm, s1 and s2 heart sounds present.  Lungs: Clear lung sounds in all lobes. Respirations equal and unlabored. Abdomen:  +BS, soft, non-tender and non-distended. No rebound or guarding. No HSM or masses noted. Derm: No palmar erythema or jaundice Msk:  Symmetrical without gross deformities. Normal posture. Extremities:  Without edema. Neurologic:  Alert and  oriented x4 Psych:  Alert and  cooperative. Normal mood and affect.  Invalid input(s): 6 MONTHS   ASSESSMENT: Meghan Acosta is a 48 y.o. female presenting today for follow up of GERD, IBS with constipation and bloating/gas  GERD: improved on protonix  40mg  daily. Has occasional breakthrough with certain foods, eating too late. Will continue with daily PPI for now, strict reflux precautions to include being mindful of greasy, spicy, fried, citrus foods, caffeine, carbonated drinks, chocolate and alcohol as these can increase reflux symptoms, Stay upright 2-3 hours after eating, prior to lying down and avoid eating late in the evenings.  IBS with Constipation: improved with linzess  , having 2-4 BMs per day, usually solid but soft in nature, abdominal pain has improved with use of linzess . Will continue with linzess  and good water  intake, high fiber diet   Bloating/gas: not improving much with gas x, passing some flatus. Often feels bloated/full. No abdominal pain, rectal bleeding or melena. Weight is stable. May be dealing with  combination of SIBO/IBS contributing to her symptoms. Will trial course of xifaxan to see if this improves her symptoms. In the meantime can start daily probiotic.    PLAN:  -continue linzess  290mcg daily -Increase water  intake, aim for atleast 64 oz per day -Increase fruits, veggies and whole grains, kiwi and prunes are especially good for constipation -xifaxan 550mg  TID x14 days (start after hernia repair surgery) -continue protonix  40mg  daily  -start daily probiotic, take apart from xifaxan  All questions were answered, patient verbalized understanding and is in agreement with plan as outlined above.    Follow Up: 3 months   Meghan Shiffman L. Edwyna Dangerfield, MSN, APRN, AGNP-C Adult-Gerontology Nurse Practitioner [facility]

## 2023-11-30 DIAGNOSIS — E28319 Asymptomatic premature menopause: Secondary | ICD-10-CM | POA: Diagnosis not present

## 2023-11-30 DIAGNOSIS — Z130 Encounter for screening for diseases of the blood and blood-forming organs and certain disorders involving the immune mechanism: Secondary | ICD-10-CM | POA: Diagnosis not present

## 2023-11-30 DIAGNOSIS — Z01419 Encounter for gynecological examination (general) (routine) without abnormal findings: Secondary | ICD-10-CM | POA: Diagnosis not present

## 2023-12-02 DIAGNOSIS — J019 Acute sinusitis, unspecified: Secondary | ICD-10-CM | POA: Diagnosis not present

## 2023-12-05 NOTE — Patient Instructions (Signed)
 Your procedure is scheduled on: 12/09/2023  Report to [facility] Main Entrance at    6:00 AM.  Call this number if you have problems the morning of surgery: [phone]   Remember:   Do not Eat or Drink after midnight         No Smoking the morning of surgery  :  Take these medicines the morning of surgery with A SIP OF WATER : Pantoprazole    Do not wear jewelry, make-up or nail polish.  Do not wear lotions, powders, or perfumes. You may wear deodorant.  Do not shave 48 hours prior to surgery. Men may shave face and neck.  Do not bring valuables to the hospital.  Contacts, dentures or bridgework may not be worn into surgery.  Leave suitcase in the car. After surgery it may be brought to your room.  For patients admitted to the hospital, checkout time is 11:00 AM the day of discharge.   Patients discharged the day of surgery will not be allowed to drive home.    Special Instructions: Shower using CHG night before surgery and shower the day of surgery use CHG.  Use special wash - you have one bottle of CHG for all showers.  You should use approximately 1/2 of the bottle for each shower. How to Use Chlorhexidine at Home in the Shower Chlorhexidine gluconate (CHG) is a germ-killing (antiseptic) wash that's used to clean the skin. It can get rid of the germs that normally live on the skin and can keep them away for about 24 hours. If you're having surgery, you may be told to shower with CHG at home the night before surgery. This can help lower your risk for infection. To use CHG wash in the shower, follow the steps below. Supplies needed: CHG body wash. Clean washcloth. Clean towel. How to use CHG in the shower Follow these steps unless you're told to use CHG in a different way: Start the shower. Use your normal soap and shampoo to wash your face and hair. Turn off the shower or move out of the shower stream. Pour CHG onto a clean washcloth. Do not use any type of brush or rough  sponge. Start at your neck, washing your body down to your toes. Make sure you: Wash the part of your body where the surgery will be done for at least 1 minute. Do not scrub. Do not use CHG on your head or face unless your health care provider tells you to. If it gets into your ears or eyes, rinse them well with water . Do not wash your genitals with CHG. Wash your back and under your arms. Make sure to wash skin folds. Let the CHG sit on your skin for 1-2 minutes or as long as told. Rinse your entire body in the shower, including all body creases and folds. Turn off the shower. Dry off with a clean towel. Do not put anything on your skin afterward, such as powder, lotion, or perfume. Put on clean clothes or pajamas. If it's the night before surgery, sleep in clean sheets. General tips Use CHG only as told, and follow the instructions on the label. Use the full amount of CHG as told. This is often one bottle. Do not smoke and stay away from flames after using CHG. Your skin may feel sticky after using CHG. This is normal. The sticky feeling will go away as the CHG dries. Do not use CHG: If you have a chlorhexidine allergy or have reacted  to chlorhexidine in the past. On open wounds or areas of skin that have broken skin, cuts, or scrapes. On babies younger than 26 months of age. Contact a health care provider if: You have questions about using CHG. Your skin gets irritated or itchy. You have a rash after using CHG. You swallow any CHG. Call your local poison control center [phone] in the [place] Your eyes itch badly, or they become very red or swollen. Your hearing changes. You have trouble seeing. If you can't reach your provider, go to an urgent care or emergency room. Do not drive yourself. Get help right away if: You have swelling or tingling in your mouth or throat. You make high-pitched whistling sounds when you breathe, most often when you breathe out (wheeze). You have  trouble breathing. These symptoms may be an emergency. Call 911 right away. Do not wait to see if the symptoms will go away. Do not drive yourself to the hospital. This information is not intended to replace advice given to you by your health care provider. Make sure you discuss any questions you have with your health care provider. Document Revised: 07/27/2022 Document Reviewed: 07/23/2021 Elsevier Patient Education  2024 Elsevier Inc.  Laparoscopic Surgery for Belly Hernias: What to Know After After the procedure, it's common to have pain, discomfort, or soreness. Follow these instructions at home: Medicines Take your medicines only as told. You may need to take steps to help treat or prevent trouble pooping (constipation), such as: Taking medicines to help you poop. Eating foods high in fiber, like beans, whole grains, and fresh fruits and vegetables. Drinking more fluids as told. Ask your health care provider if it's safe to drive or use machines while taking your medicine. Incision care  Take care of the cuts in your belly as told. Make sure you: Wash your hands with soap and water  for at least 20 seconds before and after you change your bandage. If you can't use soap and water , use hand sanitizer. Change your bandage. Leave stitches or skin glue alone. Leave tape strips alone unless you're told to take them off. You may trim the edges of the tape strips if they curl up. Check the cuts on your belly every day for signs of infection. Check for: More redness, swelling, or pain. More fluid or blood. Warmth. Pus or a bad smell. Activity Rest as told. Get up to take short walks at least every 2 hours during the day. This helps you breathe better and keeps your blood flowing. Ask for help if you feel weak or unsteady. Do not take baths, swim, or use a hot tub until you're told it's OK. Ask if you can shower. Ask if it's OK for you to lift. If you were given a sedative, do not drive  or use machines until you're told it's safe. A sedative can make you sleepy. Ask what things are safe for you to do at home. Ask when you can go back to work or school. General instructions Hold a pillow over your belly when you cough or sneeze. This helps with pain. Wear a binder around your belly as told by your provider. Do not smoke, vape, or use nicotine or tobacco. Wear compression stockings to reduce swelling and help prevent blood clots in your legs. You may be asked to continue to do deep breathing exercises at home. This will help to prevent a lung infection. Contact a health care provider if: You have any signs of infection. You  have pain that gets worse or does not get better with medicine. You throw up or you feel like throwing up. You have a cough. You have not pooped in 3 days. You are not able to pee. You have a fever. Get help right away if: You have very bad pain in your belly. You throw up every time you eat or drink. You have redness, warmth, or pain in your leg. You have chest pain. You have trouble breathing. These symptoms may be an emergency. Call 911 right away. Do not wait to see if the symptoms will go away. Do not drive yourself to the hospital. This information is not intended to replace advice given to you by your health care provider. Make sure you discuss any questions you have with your health care provider. Document Revised: 07/20/2022 Document Reviewed: 07/20/2022 Elsevier Patient Education  2024 Elsevier Inc. General Anesthesia, Adult, Care After The following information offers guidance on how to care for yourself after your procedure. Your health care provider may also give you more specific instructions. If you have problems or questions, contact your health care provider. What can I expect after the procedure? After the procedure, it is common for people to: Have pain or discomfort at the IV site. Have nausea or vomiting. Have a sore throat  or hoarseness. Have trouble concentrating. Feel cold or chills. Feel weak, sleepy, or tired (fatigue). Have soreness and body aches. These can affect parts of the body that were not involved in surgery. Follow these instructions at home: For the time period you were told by your health care provider:  Rest. Do not participate in activities where you could fall or become injured. Do not drive or use machinery. Do not drink alcohol. Do not take sleeping pills or medicines that cause drowsiness. Do not make important decisions or sign legal documents. Do not take care of children on your own. General instructions Drink enough fluid to keep your urine pale yellow. If you have sleep apnea, surgery and certain medicines can increase your risk for breathing problems. Follow instructions from your health care provider about wearing your sleep device: Anytime you are sleeping, including during daytime naps. While taking prescription pain medicines, sleeping medicines, or medicines that make you drowsy. Return to your normal activities as told by your health care provider. Ask your health care provider what activities are safe for you. Take over-the-counter and prescription medicines only as told by your health care provider. Do not use any products that contain nicotine or tobacco. These products include cigarettes, chewing tobacco, and vaping devices, such as e-cigarettes. These can delay incision healing after surgery. If you need help quitting, ask your health care provider. Contact a health care provider if: You have nausea or vomiting that does not get better with medicine. You vomit every time you eat or drink. You have pain that does not get better with medicine. You cannot urinate or have bloody urine. You develop a skin rash. You have a fever. Get help right away if: You have trouble breathing. You have chest pain. You vomit blood. These symptoms may be an emergency. Get help right  away. Call 911. Do not wait to see if the symptoms will go away. Do not drive yourself to the hospital. Summary After the procedure, it is common to have a sore throat, hoarseness, nausea, vomiting, or to feel weak, sleepy, or fatigue. For the time period you were told by your health care provider, do not drive or use  machinery. Get help right away if you have difficulty breathing, have chest pain, or vomit blood. These symptoms may be an emergency. This information is not intended to replace advice given to you by your health care provider. Make sure you discuss any questions you have with your health care provider. Document Revised: 04/10/2021 Document Reviewed: 04/10/2021 Elsevier Patient Education  2024 [employer].

## 2023-12-06 ENCOUNTER — Encounter (HOSPITAL_COMMUNITY): Payer: Self-pay

## 2023-12-06 ENCOUNTER — Encounter (HOSPITAL_COMMUNITY)
Admission: RE | Admit: 2023-12-06 | Discharge: 2023-12-06 | Disposition: A | Source: Ambulatory Visit | Attending: General Surgery

## 2023-12-06 ENCOUNTER — Encounter: Payer: Self-pay | Admitting: General Surgery

## 2023-12-06 ENCOUNTER — Other Ambulatory Visit: Payer: Self-pay

## 2023-12-06 DIAGNOSIS — Z01812 Encounter for preprocedural laboratory examination: Secondary | ICD-10-CM | POA: Insufficient documentation

## 2023-12-06 DIAGNOSIS — I10 Essential (primary) hypertension: Secondary | ICD-10-CM | POA: Diagnosis not present

## 2023-12-06 DIAGNOSIS — K219 Gastro-esophageal reflux disease without esophagitis: Secondary | ICD-10-CM | POA: Diagnosis not present

## 2023-12-06 DIAGNOSIS — K429 Umbilical hernia without obstruction or gangrene: Secondary | ICD-10-CM | POA: Diagnosis not present

## 2023-12-06 DIAGNOSIS — Z01818 Encounter for other preprocedural examination: Secondary | ICD-10-CM

## 2023-12-06 HISTORY — DX: Gastro-esophageal reflux disease without esophagitis: K21.9

## 2023-12-06 LAB — BASIC METABOLIC PANEL WITH GFR
Anion gap: 11 (ref 5–15)
BUN: 14 mg/dL (ref 6–20)
CO2: 26 mmol/L (ref 22–32)
Calcium: 9.4 mg/dL (ref 8.9–10.3)
Chloride: 104 mmol/L (ref 98–111)
Creatinine, Ser: 1.08 mg/dL — ABNORMAL HIGH (ref 0.44–1.00)
GFR, Estimated: >60 mL/min (ref >60)
Glucose, Bld: 82 mg/dL (ref 70–99)
Potassium: 3.9 mmol/L (ref 3.5–5.1)
Sodium: 140 mmol/L (ref 135–145)

## 2023-12-06 LAB — CBC WITH DIFFERENTIAL/PLATELET
Abs Immature Granulocytes: 0.01 K/uL (ref 0.00–0.07)
Basophils Absolute: 0 K/uL (ref 0.0–0.1)
Basophils Relative: 1 %
Eosinophils Absolute: 0 K/uL (ref 0.0–0.5)
Eosinophils Relative: 0 %
HCT: 37.8 % (ref 36.0–46.0)
Hemoglobin: 12.6 g/dL (ref 12.0–15.0)
Immature Granulocytes: 0 %
Lymphocytes Relative: 42 %
Lymphs Abs: 1.9 K/uL (ref 0.7–4.0)
MCH: 31.7 pg (ref 26.0–34.0)
MCHC: 33.3 g/dL (ref 30.0–36.0)
MCV: 95 fL (ref 80.0–100.0)
Monocytes Absolute: 0.3 K/uL (ref 0.1–1.0)
Monocytes Relative: 6 %
Neutro Abs: 2.4 K/uL (ref 1.7–7.7)
Neutrophils Relative %: 51 %
Platelets: 277 K/uL (ref 150–400)
RBC: 3.98 MIL/uL (ref 3.87–5.11)
RDW: 12.3 % (ref 11.5–15.5)
WBC: 4.7 K/uL (ref 4.0–10.5)
nRBC: 0 % (ref 0.0–0.2)

## 2023-12-06 LAB — HCG, SERUM, QUALITATIVE: Preg, Serum: NEGATIVE

## 2023-12-09 ENCOUNTER — Encounter (HOSPITAL_COMMUNITY): Payer: Self-pay | Admitting: General Surgery

## 2023-12-09 ENCOUNTER — Encounter (HOSPITAL_COMMUNITY)
Admission: RE | Disposition: A | Discharge status: Home / Self Care | Payer: Self-pay | Source: Home / Self Care | Attending: General Surgery

## 2023-12-09 ENCOUNTER — Ambulatory Visit (HOSPITAL_COMMUNITY)
Admission: RE | Admit: 2023-12-09 | Discharge: 2023-12-09 | Disposition: A | Discharge status: Home / Self Care | Attending: General Surgery | Admitting: General Surgery

## 2023-12-09 ENCOUNTER — Ambulatory Visit (HOSPITAL_COMMUNITY): Payer: Self-pay

## 2023-12-09 ENCOUNTER — Other Ambulatory Visit: Payer: Self-pay

## 2023-12-09 ENCOUNTER — Encounter (HOSPITAL_COMMUNITY): Payer: Self-pay

## 2023-12-09 DIAGNOSIS — I10 Essential (primary) hypertension: Secondary | ICD-10-CM | POA: Diagnosis not present

## 2023-12-09 DIAGNOSIS — K429 Umbilical hernia without obstruction or gangrene: Secondary | ICD-10-CM | POA: Diagnosis not present

## 2023-12-09 DIAGNOSIS — K219 Gastro-esophageal reflux disease without esophagitis: Secondary | ICD-10-CM | POA: Diagnosis not present

## 2023-12-09 DIAGNOSIS — Z01818 Encounter for other preprocedural examination: Secondary | ICD-10-CM

## 2023-12-09 LAB — GLUCOSE, CAPILLARY: Glucose-Capillary: 119 mg/dL — ABNORMAL HIGH (ref 70–99)

## 2023-12-09 SURGERY — REPAIR, HERNIA, UMBILICAL, ROBOT-ASSISTED
Anesthesia: General | Site: Abdomen

## 2023-12-09 MED ORDER — PROPOFOL 500 MG/50ML IV EMUL
INTRAVENOUS | Status: AC
  Filled 2023-12-09: qty 50

## 2023-12-09 MED ORDER — OXYCODONE HCL 5 MG/5ML PO SOLN: 5.0000 mg | Freq: Once | ORAL | Status: AC | PRN

## 2023-12-09 MED ORDER — SUGAMMADEX SODIUM 200 MG/2ML IV SOLN
INTRAVENOUS | Status: DC | PRN
  Administered 2023-12-09: 200 mg via INTRAVENOUS

## 2023-12-09 MED ORDER — ONDANSETRON 4 MG PO TBDP
4.0000 mg | ORAL_TABLET | Freq: Once | ORAL | Status: AC
  Administered 2023-12-09: 4 mg via ORAL

## 2023-12-09 MED ORDER — ONDANSETRON HCL 4 MG/2ML IJ SOLN
INTRAMUSCULAR | Status: AC
  Filled 2023-12-09: qty 2

## 2023-12-09 MED ORDER — OXYCODONE HCL 5 MG PO TABS
5.0000 mg | ORAL_TABLET | Freq: Once | ORAL | Status: AC | PRN
  Administered 2023-12-09: 5 mg via ORAL
  Filled 2023-12-09: qty 1

## 2023-12-09 MED ORDER — BUPIVACAINE HCL (PF) 0.5 % IJ SOLN
INTRAMUSCULAR | Status: DC | PRN
  Administered 2023-12-09: 22 mL

## 2023-12-09 MED ORDER — CEFAZOLIN SODIUM-DEXTROSE 2-4 GM/100ML-% IV SOLN
2.0000 g | INTRAVENOUS | Status: AC
  Administered 2023-12-09: 2 g via INTRAVENOUS
  Filled 2023-12-09: qty 100

## 2023-12-09 MED ORDER — CHLORHEXIDINE GLUCONATE 0.12 % MT SOLN
OROMUCOSAL | Status: AC
  Filled 2023-12-09: qty 15

## 2023-12-09 MED ORDER — ONDANSETRON 4 MG PO TBDP
ORAL_TABLET | ORAL | Status: AC
  Filled 2023-12-09: qty 1

## 2023-12-09 MED ORDER — LACTATED RINGERS IV SOLN: INTRAVENOUS | Status: DC

## 2023-12-09 MED ORDER — FENTANYL CITRATE (PF) 250 MCG/5ML IJ SOLN
INTRAMUSCULAR | Status: AC
  Filled 2023-12-09: qty 5

## 2023-12-09 MED ORDER — FENTANYL CITRATE (PF) 50 MCG/ML IJ SOSY
25.0000 ug | PREFILLED_SYRINGE | INTRAMUSCULAR | Status: DC | PRN
  Administered 2023-12-09: 50 ug via INTRAVENOUS
  Filled 2023-12-09: qty 1

## 2023-12-09 MED ORDER — PHENYLEPHRINE 80 MCG/ML (10ML) SYRINGE FOR IV PUSH (FOR BLOOD PRESSURE SUPPORT)
PREFILLED_SYRINGE | INTRAVENOUS | Status: AC
  Filled 2023-12-09: qty 10

## 2023-12-09 MED ORDER — LIDOCAINE 2% (20 MG/ML) 5 ML SYRINGE
INTRAMUSCULAR | Status: DC | PRN
  Administered 2023-12-09: 60 mg via INTRAVENOUS

## 2023-12-09 MED ORDER — ONDANSETRON HCL 4 MG/2ML IJ SOLN: 4.0000 mg | Freq: Once | INTRAMUSCULAR | Status: DC | PRN

## 2023-12-09 MED ORDER — ONDANSETRON HCL 4 MG/2ML IJ SOLN
INTRAMUSCULAR | Status: AC
  Filled 2023-12-09: qty 4

## 2023-12-09 MED ORDER — DEXAMETHASONE SOD PHOSPHATE PF 10 MG/ML IJ SOLN
INTRAMUSCULAR | Status: DC | PRN
  Administered 2023-12-09: 10 mg via INTRAVENOUS

## 2023-12-09 MED ORDER — KETOROLAC TROMETHAMINE 30 MG/ML IJ SOLN
INTRAMUSCULAR | Status: DC | PRN
  Administered 2023-12-09: 30 mg via INTRAVENOUS

## 2023-12-09 MED ORDER — ONDANSETRON HCL 4 MG/2ML IJ SOLN
INTRAMUSCULAR | Status: DC | PRN
  Administered 2023-12-09: 4 mg via INTRAVENOUS

## 2023-12-09 MED ORDER — LIDOCAINE 2% (20 MG/ML) 5 ML SYRINGE
INTRAMUSCULAR | Status: AC
Start: 2023-12-09 — End: 2023-12-09
  Filled 2023-12-09: qty 5

## 2023-12-09 MED ORDER — ROCURONIUM BROMIDE 10 MG/ML (PF) SYRINGE
PREFILLED_SYRINGE | INTRAVENOUS | Status: AC
  Filled 2023-12-09: qty 10

## 2023-12-09 MED ORDER — FENTANYL CITRATE (PF) 250 MCG/5ML IJ SOLN
INTRAMUSCULAR | Status: DC | PRN
  Administered 2023-12-09 (×5): 50 ug via INTRAVENOUS

## 2023-12-09 MED ORDER — CHLORHEXIDINE GLUCONATE 0.12 % MT SOLN
15.0000 mL | Freq: Once | OROMUCOSAL | Status: AC
  Administered 2023-12-09: 15 mL via OROMUCOSAL

## 2023-12-09 MED ORDER — PROPOFOL 500 MG/50ML IV EMUL
INTRAVENOUS | Status: DC | PRN
  Administered 2023-12-09: 130 mg via INTRAVENOUS

## 2023-12-09 MED ORDER — CHLORHEXIDINE GLUCONATE CLOTH 2 % EX PADS: 6.0000 | MEDICATED_PAD | Freq: Once | CUTANEOUS | Status: DC

## 2023-12-09 MED ORDER — OXYCODONE HCL 5 MG PO TABS: 5.0000 mg | ORAL_TABLET | ORAL | 0 refills | Status: DC | PRN

## 2023-12-09 MED ORDER — MIDAZOLAM HCL 5 MG/5ML IJ SOLN
INTRAMUSCULAR | Status: DC | PRN
Start: 2023-12-09 — End: 2023-12-09
  Administered 2023-12-09: 2 mg via INTRAVENOUS

## 2023-12-09 MED ORDER — CHLORHEXIDINE GLUCONATE CLOTH 2 % EX PADS
6.0000 | MEDICATED_PAD | Freq: Once | CUTANEOUS | Status: AC
  Administered 2023-12-09: 6 via TOPICAL

## 2023-12-09 MED ORDER — STERILE WATER FOR IRRIGATION IR SOLN
Status: DC | PRN
  Administered 2023-12-09: 500 mL

## 2023-12-09 MED ORDER — MIDAZOLAM HCL 2 MG/2ML IJ SOLN
INTRAMUSCULAR | Status: AC
  Filled 2023-12-09: qty 2

## 2023-12-09 MED ORDER — ORAL CARE MOUTH RINSE: 15.0000 mL | Freq: Once | OROMUCOSAL | Status: AC

## 2023-12-09 MED ORDER — ONDANSETRON HCL 4 MG PO TABS
4.0000 mg | ORAL_TABLET | Freq: Once | ORAL | Status: DC
  Filled 2023-12-09: qty 1

## 2023-12-09 MED ORDER — BUPIVACAINE HCL (PF) 0.5 % IJ SOLN
INTRAMUSCULAR | Status: AC
  Filled 2023-12-09: qty 30

## 2023-12-09 MED ORDER — ROCURONIUM BROMIDE 10 MG/ML (PF) SYRINGE
PREFILLED_SYRINGE | INTRAVENOUS | Status: DC | PRN
Start: 2023-12-09 — End: 2023-12-09
  Administered 2023-12-09: 60 mg via INTRAVENOUS

## 2023-12-09 SURGICAL SUPPLY — 37 items
CHLORAPREP W/TINT 26 (MISCELLANEOUS) ×1 IMPLANT
COVER LIGHT HANDLE (MISCELLANEOUS) IMPLANT
COVER MAYO STAND XLG (MISCELLANEOUS) ×1 IMPLANT
COVER TIP SHEARS 8 DVNC (MISCELLANEOUS) ×1 IMPLANT
DERMABOND ADVANCED .7 DNX12 (GAUZE/BANDAGES/DRESSINGS) ×1 IMPLANT
DRAPE ARM DVNC X/XI (DISPOSABLE) ×3 IMPLANT
DRAPE COLUMN DVNC XI (DISPOSABLE) ×1 IMPLANT
DRIVER NDL MEGA SUTCUT DVNCXI (INSTRUMENTS) ×1 IMPLANT
DRIVER NDLE MEGA SUTCUT DVNCXI (INSTRUMENTS) ×1 IMPLANT
ELECTRODE REM PT RTRN 9FT ADLT (ELECTROSURGICAL) ×1 IMPLANT
FORCEPS BPLR R/ABLATION 8 DVNC (INSTRUMENTS) ×1 IMPLANT
GLOVE BIOGEL PI IND STRL 7.0 (GLOVE) ×3 IMPLANT
GLOVE SURG SS PI 7.5 STRL IVOR (GLOVE) ×2 IMPLANT
GOWN STRL REUS W/TWL LRG LVL3 (GOWN DISPOSABLE) ×3 IMPLANT
KIT TURNOVER KIT A (KITS) ×1 IMPLANT
MANIFOLD NEPTUNE II (INSTRUMENTS) ×1 IMPLANT
MESH VENTRALIGHT ST 4.5IN (Mesh General) IMPLANT
NDL HYPO 21X1.5 SAFETY (NEEDLE) ×1 IMPLANT
NDL INSUFFLATION 14GA 120MM (NEEDLE) ×1 IMPLANT
NEEDLE HYPO 21X1.5 SAFETY (NEEDLE) ×1 IMPLANT
NEEDLE INSUFFLATION 14GA 120MM (NEEDLE) ×1 IMPLANT
OBTURATOR OPTICALSTD 8 DVNC (TROCAR) ×1 IMPLANT
PACK LAP CHOLE LZT030E (CUSTOM PROCEDURE TRAY) ×1 IMPLANT
PENCIL HANDSWITCHING (ELECTRODE) ×1 IMPLANT
POSITIONER HEAD 8X9X4 ADT (SOFTGOODS) ×1 IMPLANT
SCISSORS MNPLR CVD DVNC XI (INSTRUMENTS) ×1 IMPLANT
SEAL UNIV 5-12 XI (MISCELLANEOUS) ×3 IMPLANT
SET BASIN LINEN APH (SET/KITS/TRAYS/PACK) ×1 IMPLANT
SET TUBE SMOKE EVAC HIGH FLOW (TUBING) ×1 IMPLANT
SUT MNCRL AB 4-0 PS2 18 (SUTURE) ×2 IMPLANT
SUT STRATA 3-0 SH (SUTURE) ×2 IMPLANT
SUT VICRYL 0 UR6 27IN ABS (SUTURE) IMPLANT
SUTURE STRATFX 0 PDS+ CT-2 23 (SUTURE) ×1 IMPLANT
SYR 30ML LL (SYRINGE) ×1 IMPLANT
SYSTEM RETRIEVL 5MM INZII UNIV (BASKET) IMPLANT
TAPE TRANSPORE STRL 2 31045 (GAUZE/BANDAGES/DRESSINGS) ×1 IMPLANT
WATER STERILE IRR 500ML POUR (IV SOLUTION) ×1 IMPLANT

## 2023-12-09 NOTE — Transfer of Care (Signed)
 Immediate Anesthesia Transfer of Care Note  Patient: Meghan Acosta  Procedure(s) Performed: REPAIR, HERNIA, UMBILICAL, ROBOT-ASSISTED (Abdomen)  Patient Location: PACU  Anesthesia Type:General  Level of Consciousness: drowsy  Airway & Oxygen Therapy: Patient Spontanous Breathing and Patient connected to nasal cannula oxygen  Post-op Assessment: Report given to RN and Post -op Vital signs reviewed and stable  Post vital signs: Reviewed and stable  Last Vitals:  Vitals Value Taken Time  BP 113/67 12/09/23 08:57  Temp 97.6   Pulse 84 12/09/23 08:58  Resp 16 12/09/23 08:58  SpO2 99 % 12/09/23 08:58  Vitals shown include unfiled device data.  Last Pain:  Vitals:   12/09/23 0643  TempSrc: Oral  PainSc: 0-No pain      Patients Stated Pain Goal: 4 (12/09/23 0645)  Complications: No notable events documented.

## 2023-12-09 NOTE — Anesthesia Postprocedure Evaluation (Signed)
 Anesthesia Post Note  Patient: Meghan Acosta  Procedure(s) Performed: REPAIR, HERNIA, UMBILICAL, ROBOT-ASSISTED (Abdomen)  Patient location during evaluation: Phase II Anesthesia Type: General Level of consciousness: awake Pain management: pain level controlled Vital Signs Assessment: post-procedure vital signs reviewed and stable Respiratory status: spontaneous breathing and respiratory function stable Cardiovascular status: blood pressure returned to baseline and stable Postop Assessment: no headache and no apparent nausea or vomiting Anesthetic complications: no Comments: Late entry   No notable events documented.   Last Vitals:  Vitals:   12/09/23 1010 12/09/23 1038  BP: 117/80 115/72  Pulse: 77 74  Resp: 18 18  Temp: 36.6 C   SpO2: 100% 96%    Last Pain:  Vitals:   12/09/23 1010  TempSrc: Oral  PainSc: 4                  Meghan Acosta

## 2023-12-09 NOTE — Anesthesia Procedure Notes (Signed)
 Procedure Name: Intubation Date/Time: 12/09/2023 7:41 AM  Performed by: Donna Birmingham, RNPre-anesthesia Checklist: Patient identified, Emergency Drugs available, Suction available and Patient being monitored Patient Re-evaluated:Patient Re-evaluated prior to induction Oxygen Delivery Method: Circle system utilized Preoxygenation: Pre-oxygenation with 100% oxygen Induction Type: IV induction Ventilation: Mask ventilation without difficulty Laryngoscope Size: Mac and 3 Grade View: Grade I Tube size: 7.0 mm Number of attempts: 1 Placement Confirmation: ETT inserted through vocal cords under direct vision, breath sounds checked- equal and bilateral and positive ETCO2 Secured at: 21 cm Tube secured with: Tape Dental Injury: Teeth and Oropharynx as per pre-operative assessment

## 2023-12-09 NOTE — Interval H&P Note (Signed)
 History and Physical Interval Note:  12/09/2023 7:12 AM  Meghan Acosta  has presented today for surgery, with the diagnosis of HERNIA, UMBILICAL 1.5 CM.  The various methods of treatment have been discussed with the patient and family. After consideration of risks, benefits and other options for treatment, the patient has consented to  Procedure(s): REPAIR, HERNIA, UMBILICAL, ROBOT-ASSISTED (N/A) as a surgical intervention.  The patient's history has been reviewed, patient examined, no change in status, stable for surgery.  I have reviewed the patient's chart and labs.  Questions were answered to the patient's satisfaction.     [name]

## 2023-12-09 NOTE — Anesthesia Preprocedure Evaluation (Signed)
 Anesthesia Evaluation  Patient identified by MRN, date of birth, ID band Patient awake    Reviewed: Allergy & Precautions, H&P , NPO status , Patient's Chart, lab work & pertinent test results, reviewed documented beta blocker date and time   Airway Mallampati: II  TM Distance: >3 FB Neck ROM: full    Dental no notable dental hx.    Pulmonary neg pulmonary ROS   Pulmonary exam normal breath sounds clear to auscultation       Cardiovascular Exercise Tolerance: Good hypertension, negative cardio ROS  Rhythm:regular Rate:Normal     Neuro/Psych negative neurological ROS  negative psych ROS   GI/Hepatic Neg liver ROS,GERD  ,,  Endo/Other  negative endocrine ROS    Renal/GU negative Renal ROS  negative genitourinary   Musculoskeletal   Abdominal   Peds  Hematology negative hematology ROS (+)   Anesthesia Other Findings   Reproductive/Obstetrics negative OB ROS                             Anesthesia Physical Anesthesia Plan  ASA: 2  Anesthesia Plan: General and General ETT   Post-op Pain Management:    Induction:   PONV Risk Score and Plan: Ondansetron  Airway Management Planned:   Additional Equipment:   Intra-op Plan:   Post-operative Plan:   Informed Consent: I have reviewed the patients History and Physical, chart, labs and discussed the procedure including the risks, benefits and alternatives for the proposed anesthesia with the patient or authorized representative who has indicated his/her understanding and acceptance.     Dental Advisory Given  Plan Discussed with: CRNA  Anesthesia Plan Comments:        Anesthesia Quick Evaluation

## 2023-12-09 NOTE — Op Note (Signed)
 Patient:  Meghan Acosta  DOB:  [birth year]  MRN:  [id number]   Preop Diagnosis: Umbilical hernia  Postop Diagnosis: Same  Procedure: Robotic assisted laparoscopic umbilical herniorrhaphy with mesh  Surgeon: [name], MD  Anes: General Endotracheal  Indications: Patient is a 48 year old black female who presents with a symptomatic umbilical hernia.  The risks and benefits of the procedure including bleeding, infection, mesh use, bowel injury, and the possibility of an open procedure were fully explained to the patient, who gave informed consent.  Procedure note: The patient was placed in the supine position.  After induction of general endotracheal anesthesia, the abdomen was prepped and draped using the usual sterile technique with ChloraPrep.  Surgical site confirmation was performed.  An incision was made in the left upper quadrant at Palmer's point.  A Veress needle was introduced into the abdominal cavity and confirmation of placement was done using the saline drop test.  The abdomen was then insufflated to 15 mmHg pressure.  An 8 mm trocar was introduced into the abdominal cavity under direct visualization without difficulty.  Additional 8 mm trocars were placed in the left flank and left lower quadrant regions.  The robot was then docked and targeted.  The patient had an approximately 2 cm in diameter umbilical hernia.  The hernia sac as well as adipose tissue was freed away from the umbilicus.  The defect was closed using a 0 STRATAFIX running suture.  An 8 cm round Bard Ventralight DualMesh was then inserted and secured to the abdominal wall using a 3-0 Stratafix running suture.  The robot was undocked and all air was evacuated from the abdominal cavity prior to the removal of the trocars.  All wounds were irrigated with normal saline.  All wounds were injected with 0.5% Sensorcaine.  The left upper quadrant fascia was reapproximated using an 0 Vicryl suture.  All incisions were  closed using a 4-0 Monocryl subcuticular suture.  All tape and needle counts were correct at the end of the procedure.  The patient was extubated in the operating room and transferred to PACU in stable condition.  Complications: None  EBL: Minimal  Specimen: None

## 2023-12-09 NOTE — Progress Notes (Addendum)
 Patient became dizzy and nauseated when attempting to discharge to car. She was brought back to phase 2 and placed in the recliner.  Zofran  po was given and vital signs were stable along with a CBG 119.  Patient began to feel much better after zofran .  Dr Kendell in to see patient.  Patient can be discharged at this point when she is ready.

## 2023-12-13 ENCOUNTER — Telehealth: Payer: Self-pay | Admitting: *Deleted

## 2023-12-13 NOTE — Telephone Encounter (Signed)
 Surgical Date: 12/09/2023 Procedure: XI ROBOTIC ASSISTED UMBILICAL HERNIA REPAIR W/ MESH  Received call from patient [phone]~ telephone.   Patient reports worsening lower abdominal pain when attempting to have BM. Advised that pain is normal and will improve. Advised to continue to use pain management and to use stool softeners to avoid muscle strain.   Patient also reports achy pain to lower right leg. States that she noted it while resting overnight. States that it is improved with moving and stretching, but does not resolve. Unable to see any injury or discoloration. Unable to determine if leg is swollen.   Provider made aware.

## 2023-12-14 ENCOUNTER — Encounter: Payer: Self-pay | Admitting: General Surgery

## 2023-12-14 ENCOUNTER — Ambulatory Visit: Admitting: General Surgery

## 2023-12-14 ENCOUNTER — Other Ambulatory Visit (HOSPITAL_COMMUNITY)

## 2023-12-14 DIAGNOSIS — Z9889 Other specified postprocedural states: Secondary | ICD-10-CM

## 2023-12-14 DIAGNOSIS — Z8719 Personal history of other diseases of the digestive system: Secondary | ICD-10-CM

## 2023-12-14 NOTE — Progress Notes (Signed)
 Subjective:     Meghan Acosta  Virtual postoperative telephone visit performed with patient.  I was in the office.  She states she is having some abdominal pain but it is easing.  She had some bilateral calf pain without swelling yesterday, but it has resolved today.  She denies any nausea or vomiting. Objective:    LMP 12/02/2023 (Approximate)   General:  alert, cooperative, and no distress       Assessment:    Doing well postoperatively.    Plan:   Patient will return to my care on 01/03/24 for evaluation and a letter to return to work.  Patient may increase her activity as able.  Still avoid any heavy lifting over 25 pounds.  Total telephone time was 3 minutes.

## 2023-12-15 ENCOUNTER — Telehealth: Payer: Self-pay | Admitting: Family Medicine

## 2023-12-15 ENCOUNTER — Telehealth (INDEPENDENT_AMBULATORY_CARE_PROVIDER_SITE_OTHER): Payer: Self-pay

## 2023-12-15 NOTE — Telephone Encounter (Signed)
 STD paperwork completed and faxed to [facility] at [phone] and received confirmation.   Patient out of work starting 12/09/2023 and may return to work unrestricted on 01/30/2024.

## 2023-12-15 NOTE — Telephone Encounter (Signed)
 Patient called to find out if she supposed to start Xifaxan after her hernia repair. Patient reports she had the procedure last week. Per Ov note from [facility] dated 11/28/2023:  xifaxan 550mg  TID x14 days (start after hernia repair surgery)   I made the patient aware, yes start the Xifaxan as per Chelsea's instructions from ov on 11/28/2023.

## 2023-12-31 DIAGNOSIS — R590 Localized enlarged lymph nodes: Secondary | ICD-10-CM | POA: Diagnosis not present

## 2024-01-02 ENCOUNTER — Encounter (INDEPENDENT_AMBULATORY_CARE_PROVIDER_SITE_OTHER): Payer: Self-pay

## 2024-01-03 ENCOUNTER — Ambulatory Visit: Admitting: General Surgery

## 2024-01-03 ENCOUNTER — Encounter: Payer: Self-pay | Admitting: General Surgery

## 2024-01-03 VITALS — BP 111/75 | HR 80 | Temp 98.0°F | Resp 16 | Ht 67.0 in | Wt 150.0 lb

## 2024-01-03 DIAGNOSIS — Z8719 Personal history of other diseases of the digestive system: Secondary | ICD-10-CM

## 2024-01-03 DIAGNOSIS — Z9889 Other specified postprocedural states: Secondary | ICD-10-CM | POA: Diagnosis not present

## 2024-01-03 NOTE — Progress Notes (Signed)
 Subjective:     Meghan Acosta  Patient here for follow-up, status post robotic assisted laparoscopic umbilical herniorrhaphy with mesh.  She is doing well  She is still is having mild incisional pain.  She is avoiding significant heavy lifting. Objective:    BP 111/75   Pulse 80   Temp 98 F (36.7 C) (Oral)   Resp 16   Ht 5' 7 (1.702 m)   Wt 150 lb (68 kg)   LMP 12/02/2023 (Approximate)   SpO2 98%   BMI 23.49 kg/m   General:  alert, cooperative, and no distress  Abdomen is soft, incisions healing well.     Assessment:    Doing well postoperatively.  Mild incisional pain, resolving. Plan:   May start increasing heavy lifting gradually over 25 pounds.  May return to work without restrictions on January 30, 2024.  Follow-up here as needed.

## 2024-01-05 ENCOUNTER — Encounter: Payer: Self-pay | Admitting: Family Medicine

## 2024-01-10 DIAGNOSIS — E785 Hyperlipidemia, unspecified: Secondary | ICD-10-CM | POA: Diagnosis not present

## 2024-01-18 ENCOUNTER — Ambulatory Visit (INDEPENDENT_AMBULATORY_CARE_PROVIDER_SITE_OTHER): Admitting: Gastroenterology

## 2024-01-18 ENCOUNTER — Telehealth (INDEPENDENT_AMBULATORY_CARE_PROVIDER_SITE_OTHER): Payer: Self-pay

## 2024-01-18 ENCOUNTER — Encounter (INDEPENDENT_AMBULATORY_CARE_PROVIDER_SITE_OTHER): Payer: Self-pay | Admitting: Gastroenterology

## 2024-01-18 VITALS — BP 121/79 | HR 82 | Temp 97.5°F | Ht 67.0 in | Wt 149.1 lb

## 2024-01-18 DIAGNOSIS — R1084 Generalized abdominal pain: Secondary | ICD-10-CM | POA: Insufficient documentation

## 2024-01-18 DIAGNOSIS — K581 Irritable bowel syndrome with constipation: Secondary | ICD-10-CM

## 2024-01-18 DIAGNOSIS — R140 Abdominal distension (gaseous): Secondary | ICD-10-CM

## 2024-01-18 DIAGNOSIS — R1013 Epigastric pain: Secondary | ICD-10-CM

## 2024-01-18 DIAGNOSIS — K219 Gastro-esophageal reflux disease without esophagitis: Secondary | ICD-10-CM | POA: Diagnosis not present

## 2024-01-18 MED ORDER — LINACLOTIDE 145 MCG PO CAPS: 145.0000 ug | ORAL_CAPSULE | Freq: Every day | ORAL | 1 refills | Status: AC

## 2024-01-18 NOTE — Progress Notes (Signed)
 Baron Parmelee Faizan Lezette Kitts , M.D. Gastroenterology & Hepatology [facility] [address] [place], [place] [address] Primary Care Physician: Joeann Browning, FNP [address] [place] [address]  Chief Complaint: Diarrhea and flatulence, fecal urgency  History of Present Illness:  Meghan Acosta is a 48 y.o. female who presents for evaluation of  Abdominal bloating/discomfort/constipation/GERD   Patient was last seen by [facility] 11/2023 Patient is using Linzess  290 mcg.  Had a hernia repair November 14.  Was given a course of rifaximin  which patient completed Patient reports having bowel movement daily with Linzess  as previously she was constipated but would have episodes of 3-5 bowel movements with fecal urgency. The patient denies having any nausea, vomiting, fever, chills, hematochezia, melena, hematemesis, jaundice, pruritus or weight loss.  Last ZHI:wnwz Last Colonoscopy: 2024 -patient reports having negative colonoscopy at [facility] and next due in 10 years  Past Medical History: Past Medical History:  Diagnosis Date   Allergy    GERD (gastroesophageal reflux disease)     Past Surgical History: Past Surgical History:  Procedure Laterality Date   COLONOSCOPY N/A 12/05/2013   Procedure: COLONOSCOPY;  Surgeon: Lamar CHRISTELLA Hollingshead, MD;  Location: [facility];  Service: Endoscopy;  Laterality: N/A;  1045   COLONOSCOPY     06/2022   FOOT SURGERY Left    WISDOM TOOTH EXTRACTION      Family History: Family History  Problem Relation Age of Onset   Cervical cancer Paternal Aunt    Colon cancer Neg Hx    Inflammatory bowel disease Neg Hx     Social History:Tobacco Use History[1] Social History   Substance and Sexual Activity  Alcohol Use No   Social History   Substance and Sexual Activity  Drug Use No    Allergies: Allergies[2]  Medications: Current Outpatient Medications  Medication Sig Dispense Refill    Ascorbic Acid (VITAMIN C) 1000 MG tablet Take 1,000 mg by mouth daily.     cyanocobalamin  (VITAMIN B12) 1000 MCG tablet Take 1,000 mcg by mouth daily.     LARIN 24 FE 1-20 MG-MCG(24) tablet Take 1 tablet by mouth at bedtime.     linaclotide  (LINZESS ) 290 MCG CAPS capsule Take 1 capsule (290 mcg total) by mouth daily before breakfast. 90 capsule 1   pantoprazole  (PROTONIX ) 40 MG tablet Take 1 tablet (40 mg total) by mouth daily. 60 tablet 3   No current facility-administered medications for this visit.    Review of Systems: GENERAL: negative for malaise, night sweats HEENT: No changes in hearing or vision, no nose bleeds or other nasal problems. NECK: Negative for lumps, goiter, pain and significant neck swelling RESPIRATORY: Negative for cough, wheezing CARDIOVASCULAR: Negative for chest pain, leg swelling, palpitations, orthopnea GI: SEE HPI MUSCULOSKELETAL: Negative for joint pain or swelling, back pain, and muscle pain. SKIN: Negative for lesions, rash HEMATOLOGY Negative for prolonged bleeding, bruising easily, and swollen nodes. ENDOCRINE: Negative for cold or heat intolerance, polyuria, polydipsia and goiter. NEURO: negative for tremor, gait imbalance, syncope and seizures. The remainder of the review of systems is noncontributory.   Physical Exam: BP 121/79 (BP Location: Left Arm, Patient Position: Sitting, Cuff Size: Normal)   Pulse 82   Temp (!) 97.5 F (36.4 C) (Temporal)   Ht 5' 7 (1.702 m)   Wt 149 lb 1.6 oz (67.6 kg)   BMI 23.35 kg/m  GENERAL: The patient is AO x3, in no acute distress. HEENT: Head is normocephalic and atraumatic.  EOMI are intact. Mouth is well hydrated and without lesions. NECK: Supple. No masses LUNGS: Clear to auscultation. No presence of rhonchi/wheezing/rales. Adequate chest expansion HEART: RRR, normal s1 and s2. ABDOMEN: Soft, nontender, no guarding, no peritoneal signs, and nondistended. BS +. No masses.   Imaging/Labs: as above      Latest Ref Rng & Units 12/06/2023    9:06 AM 06/23/2023    9:06 AM 03/14/2023    9:39 AM  CBC  WBC 4.0 - 10.5 K/uL 4.7  4.1  5.9   Hemoglobin 12.0 - 15.0 g/dL 87.3  87.3  87.6   Hematocrit 36.0 - 46.0 % 37.8  38.6  36.7   Platelets 150 - 400 K/uL 277  273  277    Lab Results  Component Value Date   IRON 129 06/23/2023   TIBC 489 (H) 06/23/2023   FERRITIN 49 06/23/2023    I personally reviewed and interpreted the available labs, imaging and endoscopic files.  Impression and Plan:   Meghan Acosta is a 47 y.o. female who presents for evaluation of  Abdominal bloating/discomfort/constipation/GERD   #IBS-C #GERD #Dyspepsia   Patient has vague complaints from bloating altered bowel movements with diarrhea and constipation  Previous x-ray which shows moderate amount of stool burden especially on the right side of the colon where she has some tenderness  Currently on high dose of Linzess  to 290 and having occasional diarrhea, will decrease the dose to 145 mcg  Probiotics  Low FODMAP diet  Given persistent vague symptoms will obtain cross-sectional imaging with CT abdomen pelvis  Requested the patient to give us  the name of a place where she had her last colonoscopy so we can obtain those records  Lab work with CRP, celiac, fecal calprotectin and alpha gal   All questions were answered.      Demontrez Rindfleisch Faizan Chinaza Rooke, MD Gastroenterology and Hepatology [facility]   This chart has been completed using [facility] Dictation software, and while attempts have been made to ensure accuracy , certain words and phrases may not be transcribed as intended      [1]  Social History Tobacco Use  Smoking Status Never  Smokeless Tobacco Never  [2] No Known Allergies

## 2024-01-18 NOTE — Telephone Encounter (Signed)
 Spoke with patient, scheduled CT for 02/17/2024 at 9:00am. Patient aware and verbalized understanding.

## 2024-01-18 NOTE — Telephone Encounter (Signed)
 PA on [facility] for CT: Order ID: [id number]       Completed  Approval Valid Through: 01/26/2024 - 02/24/2024

## 2024-02-06 ENCOUNTER — Encounter (INDEPENDENT_AMBULATORY_CARE_PROVIDER_SITE_OTHER): Payer: Self-pay | Admitting: Gastroenterology

## 2024-02-09 LAB — ALPHA-GAL PANEL
Allergen Lamb IgE: <0.1 kU/L
Beef IgE: <0.1 kU/L
IgE (Immunoglobulin E), Serum: 30 [IU]/mL (ref 6–495)
O215-IgE Alpha-Gal: <0.1 kU/L
Pork IgE: <0.1 kU/L

## 2024-02-09 LAB — CELIAC AB TTG DGP TIGA
Deamidated Gliadin Abs, IgA: 3 U (ref 0–19)
Deamidated Gliadin Abs, IgG: 3 U (ref 0–19)
Immunoglobulin A, (IgA) QN, Serum: 87 mg/dL (ref 87–352)
t-Transglutaminase (tTG) IgA: <2 U/mL (ref 0–3)
t-Transglutaminase (tTG) IgG: <2 U/mL (ref 0–5)

## 2024-02-09 LAB — CALPROTECTIN, FECAL: Calprotectin, Fecal: 35 ug/g (ref 0–120)

## 2024-02-09 LAB — C-REACTIVE PROTEIN: CRP: 1 mg/L (ref 0–10)

## 2024-02-13 ENCOUNTER — Ambulatory Visit (INDEPENDENT_AMBULATORY_CARE_PROVIDER_SITE_OTHER): Payer: Self-pay | Admitting: Gastroenterology

## 2024-02-13 NOTE — Progress Notes (Signed)
 Labs Calprotectin 35 CRP negative Negative celiac panel Negative alpha gal  Awaiting CT abdomen pelvis

## 2024-02-17 ENCOUNTER — Ambulatory Visit (HOSPITAL_COMMUNITY)
Admission: RE | Admit: 2024-02-17 | Discharge: 2024-02-17 | Disposition: A | Source: Ambulatory Visit | Attending: Gastroenterology

## 2024-02-17 DIAGNOSIS — R1084 Generalized abdominal pain: Secondary | ICD-10-CM | POA: Diagnosis present

## 2024-02-17 MED ORDER — IOHEXOL 300 MG/ML  SOLN
100.0000 mL | Freq: Once | INTRAMUSCULAR | Status: AC | PRN
  Administered 2024-02-17: 100 mL via INTRAVENOUS

## 2024-02-21 NOTE — Progress Notes (Signed)
 CT Scan  IMPRESSION: Negative. No acute findings or other significant abnormality.

## 2024-03-08 ENCOUNTER — Ambulatory Visit (INDEPENDENT_AMBULATORY_CARE_PROVIDER_SITE_OTHER): Admitting: Gastroenterology
